# Patient Record
Sex: Female | Born: 1980 | Race: Black or African American | Hispanic: No | Smoking: Former smoker
Health system: Southern US, Community
[De-identification: ages and names within clinical notes are randomized; demographics above are authoritative.]

## PROBLEM LIST (undated history)

## (undated) DIAGNOSIS — I10 Essential (primary) hypertension: Secondary | ICD-10-CM

## (undated) DIAGNOSIS — R609 Edema, unspecified: Secondary | ICD-10-CM

## (undated) DIAGNOSIS — M549 Dorsalgia, unspecified: Secondary | ICD-10-CM

## (undated) DIAGNOSIS — R079 Chest pain, unspecified: Secondary | ICD-10-CM

## (undated) DIAGNOSIS — E785 Hyperlipidemia, unspecified: Secondary | ICD-10-CM

## (undated) DIAGNOSIS — R7303 Prediabetes: Secondary | ICD-10-CM

## (undated) DIAGNOSIS — E559 Vitamin D deficiency, unspecified: Secondary | ICD-10-CM

## (undated) DIAGNOSIS — F1010 Alcohol abuse, uncomplicated: Secondary | ICD-10-CM

## (undated) DIAGNOSIS — E669 Obesity, unspecified: Secondary | ICD-10-CM

## (undated) HISTORY — PX: TUBAL LIGATION: SHX77

## (undated) HISTORY — DX: Prediabetes: R73.03

## (undated) HISTORY — DX: Edema, unspecified: R60.9

## (undated) HISTORY — DX: Dorsalgia, unspecified: M54.9

## (undated) HISTORY — DX: Alcohol abuse, uncomplicated: F10.10

## (undated) HISTORY — DX: Vitamin D deficiency, unspecified: E55.9

## (undated) HISTORY — DX: Hyperlipidemia, unspecified: E78.5

## (undated) HISTORY — DX: Obesity, unspecified: E66.9

## (undated) HISTORY — DX: Essential (primary) hypertension: I10

## (undated) HISTORY — DX: Chest pain, unspecified: R07.9

---

## 2004-02-15 ENCOUNTER — Emergency Department (HOSPITAL_COMMUNITY): Admission: EM | Admit: 2004-02-15 | Discharge: 2004-02-15 | Payer: Self-pay | Admitting: Family Medicine

## 2005-04-20 ENCOUNTER — Emergency Department (HOSPITAL_COMMUNITY): Admission: EM | Admit: 2005-04-20 | Discharge: 2005-04-20 | Payer: Self-pay | Admitting: Emergency Medicine

## 2005-12-27 ENCOUNTER — Ambulatory Visit (HOSPITAL_COMMUNITY): Admission: RE | Admit: 2005-12-27 | Discharge: 2005-12-27 | Payer: Self-pay | Admitting: *Deleted

## 2006-01-11 ENCOUNTER — Ambulatory Visit (HOSPITAL_COMMUNITY): Admission: RE | Admit: 2006-01-11 | Discharge: 2006-01-11 | Payer: Self-pay | Admitting: *Deleted

## 2006-02-10 ENCOUNTER — Emergency Department (HOSPITAL_COMMUNITY): Admission: EM | Admit: 2006-02-10 | Discharge: 2006-02-10 | Payer: Self-pay | Admitting: Emergency Medicine

## 2006-02-22 ENCOUNTER — Ambulatory Visit: Payer: Self-pay | Admitting: *Deleted

## 2006-02-22 ENCOUNTER — Other Ambulatory Visit: Admission: RE | Admit: 2006-02-22 | Discharge: 2006-02-22 | Payer: Self-pay | Admitting: *Deleted

## 2006-03-08 ENCOUNTER — Ambulatory Visit: Payer: Self-pay | Admitting: Family Medicine

## 2006-06-07 ENCOUNTER — Ambulatory Visit: Payer: Self-pay | Admitting: *Deleted

## 2006-06-07 ENCOUNTER — Inpatient Hospital Stay (HOSPITAL_COMMUNITY): Admission: AD | Admit: 2006-06-07 | Discharge: 2006-06-08 | Payer: Self-pay | Admitting: Obstetrics & Gynecology

## 2006-06-11 ENCOUNTER — Inpatient Hospital Stay (HOSPITAL_COMMUNITY): Admission: AD | Admit: 2006-06-11 | Discharge: 2006-06-11 | Payer: Self-pay | Admitting: Obstetrics & Gynecology

## 2006-06-11 ENCOUNTER — Ambulatory Visit: Payer: Self-pay | Admitting: Obstetrics and Gynecology

## 2006-06-15 ENCOUNTER — Ambulatory Visit: Payer: Self-pay | Admitting: Obstetrics & Gynecology

## 2006-06-15 ENCOUNTER — Inpatient Hospital Stay (HOSPITAL_COMMUNITY): Admission: AD | Admit: 2006-06-15 | Discharge: 2006-06-19 | Payer: Self-pay | Admitting: Family Medicine

## 2006-06-16 ENCOUNTER — Encounter (INDEPENDENT_AMBULATORY_CARE_PROVIDER_SITE_OTHER): Payer: Self-pay | Admitting: Specialist

## 2006-06-22 ENCOUNTER — Inpatient Hospital Stay (HOSPITAL_COMMUNITY): Admission: AD | Admit: 2006-06-22 | Discharge: 2006-06-22 | Payer: Self-pay | Admitting: Obstetrics and Gynecology

## 2006-06-23 ENCOUNTER — Inpatient Hospital Stay (HOSPITAL_COMMUNITY): Admission: AD | Admit: 2006-06-23 | Discharge: 2006-06-23 | Payer: Self-pay | Admitting: Obstetrics and Gynecology

## 2006-11-01 ENCOUNTER — Ambulatory Visit: Payer: Self-pay | Admitting: Gynecology

## 2007-11-18 ENCOUNTER — Encounter: Payer: Self-pay | Admitting: Family Medicine

## 2007-11-18 ENCOUNTER — Ambulatory Visit: Payer: Self-pay | Admitting: Sports Medicine

## 2007-11-18 LAB — CONVERTED CEMR LAB
Antibody Screen: NEGATIVE
Basophils Absolute: 0 10*3/uL (ref 0.0–0.1)
Basophils Relative: 0 % (ref 0–1)
Hemoglobin: 11.1 g/dL — ABNORMAL LOW (ref 12.0–15.0)
Hepatitis B Surface Ag: NEGATIVE
Lymphocytes Relative: 33 % (ref 12–46)
Lymphs Abs: 2.7 10*3/uL (ref 0.7–4.0)
MCHC: 32.6 g/dL (ref 30.0–36.0)
Monocytes Relative: 5 % (ref 3–12)
Neutro Abs: 5 10*3/uL (ref 1.7–7.7)
Platelets: 303 10*3/uL (ref 150–400)
RDW: 15.1 % (ref 11.5–15.5)
Rubella: 31.6 intl units/mL — ABNORMAL HIGH

## 2007-11-26 ENCOUNTER — Encounter: Payer: Self-pay | Admitting: Family Medicine

## 2007-11-26 ENCOUNTER — Ambulatory Visit: Payer: Self-pay | Admitting: Family Medicine

## 2007-11-26 LAB — CONVERTED CEMR LAB
GC Probe Amp, Genital: NEGATIVE
Protein, U semiquant: NEGATIVE

## 2007-11-27 ENCOUNTER — Ambulatory Visit (HOSPITAL_COMMUNITY): Admission: RE | Admit: 2007-11-27 | Discharge: 2007-11-27 | Payer: Self-pay | Admitting: Family Medicine

## 2007-12-04 ENCOUNTER — Encounter: Payer: Self-pay | Admitting: Family Medicine

## 2007-12-04 ENCOUNTER — Encounter: Payer: Self-pay | Admitting: *Deleted

## 2007-12-05 ENCOUNTER — Encounter: Payer: Self-pay | Admitting: Family Medicine

## 2007-12-06 ENCOUNTER — Ambulatory Visit: Payer: Self-pay | Admitting: Family Medicine

## 2007-12-10 ENCOUNTER — Encounter: Payer: Self-pay | Admitting: Family Medicine

## 2007-12-16 ENCOUNTER — Encounter: Payer: Self-pay | Admitting: Family Medicine

## 2007-12-16 ENCOUNTER — Ambulatory Visit (HOSPITAL_COMMUNITY): Admission: RE | Admit: 2007-12-16 | Discharge: 2007-12-16 | Payer: Self-pay | Admitting: Family Medicine

## 2007-12-25 ENCOUNTER — Ambulatory Visit: Payer: Self-pay | Admitting: Family Medicine

## 2007-12-25 DIAGNOSIS — Z8742 Personal history of other diseases of the female genital tract: Secondary | ICD-10-CM

## 2007-12-25 LAB — CONVERTED CEMR LAB: Protein, U semiquant: NEGATIVE

## 2008-01-27 ENCOUNTER — Ambulatory Visit: Payer: Self-pay | Admitting: Sports Medicine

## 2008-01-27 LAB — CONVERTED CEMR LAB: Glucose, Urine, Semiquant: NEGATIVE

## 2008-02-03 ENCOUNTER — Ambulatory Visit: Payer: Self-pay | Admitting: Family Medicine

## 2008-02-03 ENCOUNTER — Encounter: Payer: Self-pay | Admitting: Family Medicine

## 2008-02-10 ENCOUNTER — Ambulatory Visit: Payer: Self-pay | Admitting: Family Medicine

## 2008-02-10 LAB — CONVERTED CEMR LAB: Protein, U semiquant: NEGATIVE

## 2008-03-02 ENCOUNTER — Ambulatory Visit: Payer: Self-pay | Admitting: Family Medicine

## 2008-03-02 LAB — CONVERTED CEMR LAB: Protein, U semiquant: NEGATIVE

## 2008-03-17 ENCOUNTER — Ambulatory Visit: Payer: Self-pay | Admitting: Family Medicine

## 2008-03-17 ENCOUNTER — Encounter: Payer: Self-pay | Admitting: Family Medicine

## 2008-03-17 LAB — CONVERTED CEMR LAB: Hemoglobin: 11 g/dL

## 2008-03-27 ENCOUNTER — Ambulatory Visit: Payer: Self-pay | Admitting: Family Medicine

## 2008-04-01 ENCOUNTER — Ambulatory Visit: Payer: Self-pay | Admitting: Family Medicine

## 2008-04-01 LAB — CONVERTED CEMR LAB: Glucose, Urine, Semiquant: NEGATIVE

## 2008-04-02 ENCOUNTER — Encounter: Payer: Self-pay | Admitting: Family Medicine

## 2008-04-09 ENCOUNTER — Ambulatory Visit: Payer: Self-pay | Admitting: Family Medicine

## 2008-04-09 LAB — CONVERTED CEMR LAB: Glucose, Urine, Semiquant: NEGATIVE

## 2008-04-13 ENCOUNTER — Ambulatory Visit: Payer: Self-pay | Admitting: Family Medicine

## 2008-04-13 LAB — CONVERTED CEMR LAB

## 2008-04-15 ENCOUNTER — Telehealth: Payer: Self-pay | Admitting: Family Medicine

## 2008-04-20 ENCOUNTER — Ambulatory Visit: Payer: Self-pay | Admitting: Sports Medicine

## 2008-04-20 ENCOUNTER — Encounter: Payer: Self-pay | Admitting: Family Medicine

## 2008-04-24 ENCOUNTER — Encounter: Payer: Self-pay | Admitting: Family Medicine

## 2008-04-24 ENCOUNTER — Telehealth: Payer: Self-pay | Admitting: *Deleted

## 2008-04-24 ENCOUNTER — Ambulatory Visit: Payer: Self-pay | Admitting: Obstetrics & Gynecology

## 2008-04-27 ENCOUNTER — Ambulatory Visit: Payer: Self-pay | Admitting: Obstetrics & Gynecology

## 2008-04-28 ENCOUNTER — Inpatient Hospital Stay (HOSPITAL_COMMUNITY): Admission: RE | Admit: 2008-04-28 | Discharge: 2008-05-01 | Payer: Self-pay | Admitting: Obstetrics & Gynecology

## 2008-04-28 ENCOUNTER — Ambulatory Visit: Payer: Self-pay | Admitting: Obstetrics & Gynecology

## 2008-04-29 ENCOUNTER — Ambulatory Visit: Payer: Self-pay | Admitting: Family Medicine

## 2008-04-29 ENCOUNTER — Encounter: Payer: Self-pay | Admitting: Obstetrics & Gynecology

## 2008-05-04 ENCOUNTER — Encounter: Payer: Self-pay | Admitting: *Deleted

## 2008-05-26 ENCOUNTER — Ambulatory Visit: Payer: Self-pay | Admitting: Family Medicine

## 2008-06-22 ENCOUNTER — Telehealth (INDEPENDENT_AMBULATORY_CARE_PROVIDER_SITE_OTHER): Payer: Self-pay | Admitting: *Deleted

## 2008-06-22 ENCOUNTER — Ambulatory Visit: Payer: Self-pay

## 2008-07-03 ENCOUNTER — Encounter: Payer: Self-pay | Admitting: Family Medicine

## 2009-01-07 ENCOUNTER — Encounter: Payer: Self-pay | Admitting: Family Medicine

## 2009-01-07 DIAGNOSIS — R221 Localized swelling, mass and lump, neck: Secondary | ICD-10-CM

## 2009-01-07 DIAGNOSIS — R220 Localized swelling, mass and lump, head: Secondary | ICD-10-CM | POA: Insufficient documentation

## 2010-12-19 ENCOUNTER — Encounter: Payer: Self-pay | Admitting: *Deleted

## 2011-03-14 NOTE — Discharge Summary (Signed)
NAMESUMAYAH, Cassandra Chandler NO.:  [id number]   MEDICAL RECORD NO.:  [id number]          PATIENT TYPE:  WOC   LOCATION:  WOC                          FACILITY:  [facility]   PHYSICIAN:  Allie Bossier, MD        DATE OF BIRTH:  June 22, 1981   DATE OF ADMISSION:  04/28/2008  DATE OF DISCHARGE:  05/01/2008                               DISCHARGE SUMMARY   ADMITTING DIAGNOSES:  35. A 30 year old gravid 5, para 1-0-3-1 with intrauterine pregnancy at      95 weeks' gestation.  2. History of prior cesarean section.  3. Desirous child labor after cesarean section.   DISCHARGE DIAGNOSES:  A 30 year old gravid 5, para 1-0-3-1,  postoperative day #2 status post repeat low-transverse cesarean section  for nonreassuring fetal heart rate.   PROCEDURE:  Repeat low-transverse cesarean section.   ADMITTING COURSE:  Please see written H and P.   HOSPITAL COURSE:  The patient was admitted for a scheduled induction of  labor at 24 weeks' gestation desiring a child labor after cesarean  section.  Risks and benefits of TOLAC were explained and the patient  verbalized understanding and consents were signed.  Foley bulb induction  was started at approximately 9 a.m. on June 30, and Pitocin was started  for cervical ripening.  The Foley bulb fell out on June 30 at  approximately 18:58 in the evening and the patient 4-5 cm dilated in a  cephalic presentation.  Artificial rupture of membranes were performed  to clear fluid and Pitocin was continued to be titrated for  contractions.  Fetal heart rate was reassuring in the 140s to 145.  The  patient progressed slowly throughout the course of the day and into the  second day of the induction.  Pitocin was stopped several times  secondary to variable decelerations; however, overall, the heart rate  was reassuring and attempts for TOLAC were continued secondary to  reassuring fetal heart rate and the patient's progression of cervical  dilation.  At  approximately 7:50 on July 1, the patient went at 7-8 cm  80% effaced, at a -1 station.  The patient progressed to __________;  however, at approximately 12:50 in the afternoon on July 1, a stat  cesarean section was performed secondary to fetal heart rate in the 60s  that was not able to restimulate it back to baseline with scalp  stimulation and stat cesarean section was called by Dr. Nicholaus Chandler.  Repeat low-transverse cesarean section was performed on April 29, 2008.   PREOPERATIVE DIAGNOSIS:  Induction of labor for post dates, trial of  labor after cesarean section (TOLAC), obesity, and fetal bradycardia.   POSTOPERATIVE DIAGNOSIS:  Induction of labor for post dates, trial of  labor after cesarean section (TOLAC), obesity, and fetal bradycardia.   PROCEDURE:  Repeat low-transverse cesarean section.  This surgery  produced a female infant with Apgars of 8 and 9, weighing 7 pounds 5  ounces.  Cord pH was 7.21, and estimated blood loss was 800 mL.  On  postoperative day #1, the patient's pain has been well controlled  on  oral pain meds.  Vital signs were stable.  She was breastfeeding.  Her  sutures were clean, dry, and intact.  On postoperative day #1, her  hemoglobin was 9.8, hematocrit was 28.7, white blood count was 9.7, and  platelets were 190.  On postoperative day #2, the patient was  discharged.  She had no complaints throughout.  Her vital signs were  stable and she was afebrile.  Her exam was within normal limits.   DISCHARGE DIAGNOSES:  A 30 year old gravida 5, para 2-0-1-2,  postoperative day #2 status post repeat low-transverse cesarean section  for nonreassuring fetal heart rate.   DISCHARGE CONDITION:  Good.   FOLLOWUP PLAN:  The patient is instructed to follow up next week with  [facility]qu[facility] for an incision check, that will  also coincide with the weight check for the baby.  She plans Micronor  for contraception at present.   DISCHARGE  PRESCRIPTIONS:  1. Percocet 5/325, #30, one to two p.o. q.4-6 h. p.r.n.  2. Ferrous sulphate 325 mg p.o. daily.  3. Colace 100 mg p.o. daily.  4. Micronor 1 p.o. daily.      Cassandra Chandler, C.N.M.      Allie Bossier, MD  Electronically Signed    SS/MEDQ  D:  05/01/2008  T:  05/01/2008  Job:  [id number]

## 2011-03-14 NOTE — Op Note (Signed)
NAME:  Cassandra Chandler, HUNDERTMARK NO.:  [id number]   MEDICAL RECORD NO.:  [id number]           PATIENT TYPE:   LOCATION:                                 FACILITY:   PHYSICIAN:  Allie Bossier, MD        DATE OF BIRTH:  [birth year]   DATE OF PROCEDURE:  DATE OF DISCHARGE:                               OPERATIVE REPORT   PREOPERATIVE DIAGNOSES:  Fetal bradycardia, failed induction of labor,  trial of labor after C-section, and obesity.   POSTOPERATIVE DIAGNOSES:  Fetal bradycardia, failed induction of labor,  trial of labor after C-section, and obesity.   PROCEDURE:  Repeat low transverse cesarean section, emergent.   SURGEON:  Myra C. Marice Potter, MD   ANESTHESIA:  Epidural, Burnett Corrente, MD   COMPLICATIONS:  None.   ESTIMATED BLOOD LOSS:  800 mL.   SPECIMENS:  Cord blood and placenta.   FINDINGS:  Living female infant with Apgars of 8 and 9 at 1 and 5  minutes, weight 7 pounds 5 ounces, cord pH 7.21.  Normal pelvic anatomy  with the exception of some adhesions of the omentum to the anterior  abdominal wall on the patient's right-hand side.   DETAILED PROCEDURE AND FINDINGS:  I was called into the room.  The fetal  heart rate via fetal scalp electrode was persistently in the 60s.  It  was not responding to patient position nor scalp stimulation as it had  been 4 hours ago.  I explained to her that C-section would be in the  baby's best interest.  She consented and she was rapidly taken to the  OR.  In the process, her epidural was bolused for surgery.  She was  taken to the operating room and quickly her abdomen was prepped and  draped in the usual sterile fashion.  Adequate anesthesia was assured.  It was noted that her Foley catheter was draining blood-tinged urine  prior to starting the case.  After adequate anesthesia was assured, a  transverse incision was made approximately 2 cm above the symphysis at  the natural crease.  Incision was carried down through the large  amount  of subcutaneous tissue to the fascia.  Fascia was scored in the midline.  Fascial incision was extended bilaterally.  The rectus muscles were  partially separated in a transverse fashion.  The peritoneum was entered  sharply and extended bluntly.  A bladder blade was placed.  A transverse  incision was made on the moderately well-developed lower uterine  segment.  Amniotomy was performed with hemostats.  The uterine incision  was extended bluntly.  The baby was deep in the pelvis at approximately  the +1 station.  The head was brought out of the incision with the  assistance of a vacuum extractor.  The mouth and nostrils were suctioned  after delivery of the baby and showed good tone and was starting to cry  upon being removed from the uterus.  Cord was clamped and cut, and the  baby was transferred to NICU personnel for further care.  Weight and  Apgars were  listed above.  Cord pH was obtained.  The clamp came off of  the cord at this point and so cord blood was not obtained.  Placenta was  extracted manually.  The uterus was left in situ.  The interior of the  uterus was cleaned with a dry lap sponge.  The uterine incision was  closed with two layers of 0 chromic running locking suture in the second  layer and imbricating in the first.  Excellent hemostasis was noted.  By  tilting the uterus, I was able to visualize the adnexa.  They were  normal.  The fascia and rectus muscles were noted to be hemostatic.  The  fascia was closed with a #1 Prolene in a running nonlocking fashion.  No  defects were palpable.  The subcutaneous tissue was irrigated, cleaned,  and dried.  It was then infiltrated with 30 mL of 0.5% Marcaine.  Subcuticular closure was done with 3-0 Vicryl suture.  Steri-Strips were  placed.  Her Foley catheter continued to drain blood-tinged fluid.  She  tolerated the procedure well.  She was taken to recovery room in stable  condition.  Instrument, sponge, and needle  counts were correct.      Allie Bossier, MD  Electronically Signed     MCD/MEDQ  D:  04/29/2008  T:  04/30/2008  Job:  [id number]

## 2011-03-17 NOTE — Group Therapy Note (Signed)
Cassandra Chandler, Cassandra Chandler NO.:  [id number]   MEDICAL RECORD NO.:  [id number]          PATIENT TYPE:  WOC   LOCATION:  [facility]                   FACILITY:  [facility]   PHYSICIAN:  Ginger Carne, MD DATE OF BIRTH:  [birth year]   DATE OF SERVICE:  11/01/2006                                  CLINIC NOTE   DATE OF VISIT:  November 01, 2006.   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  She had evidence of ASC-H favoring high  grade lesion on Pap smear.  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  In discussion with the patient, it  was appropriate to ask her to return in six months for re-Pap smear.  This represents HPV effect and it would be unreasonable at this time to  proceed with either cryotherapy or LEEP procedure.  The patient has  verbalized her understanding of this approach and will followup at the  Health Department in six months.           ______________________________  Ginger Carne, MD     SHB/MEDQ  D:  11/01/2006  T:  11/01/2006  Job:  [id number]

## 2011-03-17 NOTE — Group Therapy Note (Signed)
NAME:  Cassandra Chandler, Cassandra Chandler NO.:  [id number]   MEDICAL RECORD NO.:  [id number]          PATIENT TYPE:  WOC   LOCATION:  [facility]                   FACILITY:  [facility]   PHYSICIAN:  Kathlyn Sacramento, M.D.   DATE OF BIRTH:  [birth year]   DATE OF SERVICE:  03/08/2006                                    CLINIC NOTE   CHIEF COMPLAINT:  The patient is here for followup on her colposcopy.   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  She underwent a colposcopy on February 22, 2006, and had a biopsy which showed squamous metaplasia associated with  stromal deciduation.   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   PLAN:  The patient is to follow up with a colposcopy after delivery.  The  plan was discussed with Dr. Shawnie Pons.           ______________________________  Kathlyn Sacramento, M.D.     AC/MEDQ  D:  03/08/2006  T:  03/09/2006  Job:  [id number]

## 2011-03-17 NOTE — Discharge Summary (Signed)
[name], PARADISE NO.:  [id number]   MEDICAL RECORD NO.:  [id number]          PATIENT TYPE:  INP   LOCATION:  9124                          [facility]   PHYSICIAN:  Lesly Dukes, M.D. DATE OF BIRTH:  1980-12-30   DATE OF ADMISSION:  06/15/2006  DATE OF DISCHARGE:  06/19/2006                                 DISCHARGE SUMMARY   DISCHARGE DIAGNOSES:  1. Induction of labor for post dates.  2. Delivery of a viable female infant.  3. Primary low transverse cesarean section for nonreassuring fetal heart      tones.   DISCHARGE MEDICATIONS:  1. Ibuprofen 600 mg p.o. q.6h. p.r.n. pain.  2. Percocet 5/325 one to two tablets p.o. q.4-6h. p.r.n. pain.  3. Colace 100 mg p.o. b.i.d. p.r.n. constipation.  4. Prenatal vitamins one p.o. daily.  5. Micronor one p.o. daily.   DISCHARGE LABORATORY DATA:  White blood count 7.8, hemoglobin 10.8,  hematocrit 31.5, platelets 228.   FOLLOWUP:  The patient is to follow up in 6 weeks at [facility] for a  postpartum visit.  The patient is to return to the MAU in 2-4 days to have  her staples removed.   PROCEDURES:  Primary LTCS for nonreassuring fetal heart tones.   CONSULTS:  None.   HOSPITAL COURSE:  Ms. Cassandra Chandler is a 30 year old G3, P0-0-2-0 who was  admitted on August 17 for induction of labor for post dates.  Her induction  was started with Cytotec x1.  The patient was contracting every 3 minutes  with this and was unable to get a second Cytotec.  A Cervidil was placed and  she progressed and Pitocin was started.  On the Pitocin the patient had late  decelerations with adequate contractions and when the Pitocin was stopped  the patient was unable to have adequate labor.  An amnioinfusion was  performed but the patient still had late decelerations while on Pitocin.  She was unable to tolerate the Pitocin and was unable to have adequate labor  without the Pitocin.  She was consented for primary LTCS for  nonreassuring  fetal heart tones.  Please see dictation for the operative report.  The  patient delivered a viable female infant, had an unremarkable postoperative  course.  She is to follow up in [facility] with [facility] and to report to the MAU  in 2-4 days for her staple removal.  She is bottle and breast feeding and is  using Ortho Micronor for birth control.  She was A positive, antibody  negative, rubella immune, and GBS negative.     ______________________________  Levander Campion, M.D.    ______________________________  Lesly Dukes, M.D.    JH/MEDQ  D:  06/19/2006  T:  06/19/2006  Job:  [id number]

## 2011-03-17 NOTE — Op Note (Signed)
NAME:  Cassandra Chandler, Cassandra Chandler NO.:  [id number]   MEDICAL RECORD NO.:  [id number]          PATIENT TYPE:  INP   LOCATION:                                [facility]   PHYSICIAN:  Lesly Dukes, M.D. DATE OF BIRTH:  [birth year]   DATE OF PROCEDURE:  06/16/2006  DATE OF DISCHARGE:                                 OPERATIVE REPORT   PREOPERATIVE DIAGNOSIS:  30 year old female with non-reassuring fetal heart  tracing unable to tolerate labor.   POSTOPERATIVE DIAGNOSIS:  30 year old female with non-reassuring fetal heart  tracing unable to tolerate labor.   PROCEDURE:  Primary low transverse cesarean section.   SURGEON:  Lesly Dukes, M.D.   ASSISTANT:  Darl Pikes Christmas, C.N.M.   ANESTHESIA:  Spinal.   SPECIMENS:  Placenta to pathology.   ESTIMATED BLOOD LOSS:  800 mL.   COMPLICATIONS:  None.   FINDINGS:  Viable female infant, vertex, clear fluid, nuchal cord x1.  Normal uterus, ovaries, and fallopian tube.  Arterial cord blood gas was  7.22.   DESCRIPTION OF PROCEDURE:  After informed consent was obtained, the patient  was taken to the operating room where epidural anesthesia was found to be  adequate.  The patient was placed in the dorsal supine position with a  leftward tilt.  A Foley was already in the bladder.  The patient was prepped  and draped in the normal sterile fashion.  A Pfannenstiel skin incision was  made with the scalpel and carried down to excellent hemostasis fascia.  The  fascia was incised in the midline and extended bilaterally.  The superior  and inferior aspect of the fascial incision were grasped with Kocher clamps,  tented up, and dissected off sharply and bluntly from the underlying rectus  muscles.  The rectus muscles were separated in the midline.  The peritoneum  was entered bluntly.  The incision was extended both superiorly and  inferiorly with good visualization of the bladder.  The bladder blade was  inserted.  The  vesicouterine peritoneum was identified, picked up, and  entered sharply with Metzenbaum scissors.  The incision was extended  bilaterally and the bladder flap was created digitally.  The bladder blade  was reinserted.  A uterine incision was made in transverse fashion in the  lower uterine segment.  This incision was extended bilaterally bluntly.  The  head delivered atraumatically.  The nose and mouth were suctioned.  The rest  of the baby delivered easily.  The cord clamped and cut and the baby was  handed off to the awaiting pediatrician.  Cord blood was sent for type and  screen and arterial cord blood sent.  The placenta delivered manually with  three vessel cord.  The uterus was exteriorized and cleared of all clots and  debris.  The uterine incision was closed using 0 Vicryl in a running locked  fashion, good hemostasis was noted.  The uterus was returned to the abdomen  and noted to be hemostatic off tension.  Copious irrigation of the  peritoneal cavity was performed and the uterus noted to be hemostatic  one  last time.  The rectus muscles were noted to be hemostatic.  The fascia was  closed with 0 Vicryl in a  running fashion.  The subcutaneous tissues were copiously irrigated and  found to be hemostatic.  The skin was closed with staples.  The patient  tolerated the procedure well.  Sponge, lap, instrument, and needle counts  were correct x 2.  The patient went to the recovery room in stable  condition.           ______________________________  Lesly Dukes, M.D.     KHL/MEDQ  D:  06/16/2006  T:  06/16/2006  Job:  [id number]

## 2011-04-17 ENCOUNTER — Ambulatory Visit (INDEPENDENT_AMBULATORY_CARE_PROVIDER_SITE_OTHER): Payer: 59 | Admitting: Family Medicine

## 2011-04-17 ENCOUNTER — Other Ambulatory Visit (HOSPITAL_COMMUNITY)
Admission: RE | Admit: 2011-04-17 | Discharge: 2011-04-17 | Disposition: A | Discharge status: Home / Self Care | Payer: 59 | Source: Ambulatory Visit | Attending: Family Medicine | Admitting: Family Medicine

## 2011-04-17 ENCOUNTER — Encounter: Payer: Self-pay | Admitting: Family Medicine

## 2011-04-17 VITALS — BP 138/86 | HR 72 | Temp 98.2°F | Ht 64.0 in | Wt 219.4 lb

## 2011-04-17 DIAGNOSIS — Z23 Encounter for immunization: Secondary | ICD-10-CM

## 2011-04-17 DIAGNOSIS — H6120 Impacted cerumen, unspecified ear: Secondary | ICD-10-CM

## 2011-04-17 DIAGNOSIS — Z124 Encounter for screening for malignant neoplasm of cervix: Secondary | ICD-10-CM

## 2011-04-17 DIAGNOSIS — Z01419 Encounter for gynecological examination (general) (routine) without abnormal findings: Secondary | ICD-10-CM

## 2011-04-17 DIAGNOSIS — H60399 Other infective otitis externa, unspecified ear: Secondary | ICD-10-CM

## 2011-04-17 DIAGNOSIS — H6090 Unspecified otitis externa, unspecified ear: Secondary | ICD-10-CM

## 2011-04-17 MED ORDER — HYDROCORTISONE-ACETIC ACID 1-2 % OT SOLN: 4.0000 [drp] | Freq: Four times a day (QID) | OTIC | Status: AC

## 2011-04-17 MED ORDER — TETANUS-DIPHTH-ACELL PERTUSSIS 5-2.5-18.5 LF-MCG/0.5 IM SUSP: 0.5000 mL | Freq: Once | INTRAMUSCULAR | Status: DC

## 2011-04-17 NOTE — Patient Instructions (Signed)
Make appt for fasting labwork ( no eating or drinking except water for 8 hours) Blood pressure is elevated 138/86.  If stays above 140/90, would consider medicines.  You can make a big difference with weight loss, nutrition. Ask for help!  See if your health insurance will cover any nutrition visit- we have a phd nutritionist in the office.  Also consider weight watchers, a Systems analyst, or reading about nutrition to learn more. I will send you a letter if results are normal, otherwise will call to discuss No Q-tips!  Try a few drops of hydrogen peroxide daily to help keep things soft. Follow-up annually or sooner if needed.

## 2011-04-17 NOTE — Progress Notes (Signed)
  Subjective:    Patient ID: Cassandra Chandler, female    DOB: 1981-01-24, 30 y.o.   MRN: [id number]  HPI  Annual Gynecological Exam  G5P2A2+1L2 Wt Readings from Last 3 Encounters:  04/17/11 219 lb 6.4 oz (99.519 kg)  06/22/08 215 lb 6.4 oz (97.705 kg)  05/26/08 208 lb 12.8 oz (94.711 kg)   Last period: 03/27/2011 Regular periods: yes Heavy bleeding: no  Sexually active: yes Birth control or hormonal therapy:Mirena Hx of STD: Patient desires STD screening Dyspareunia: No Hot flashes: No Vaginal discharge: no Dysuria:No  Last mammogram: never Breast mass or concerns: No Last WUJ:WJXBJYN years History of abnormal pap: yes  FH of breast, uterine, ovarian, colon cancer: No  Has had trouble hearing out of left ear for several days.  Uses qtips.  Notes ear wax.  Review of Systems Gen:  No fever, chills, unexplained weight loss Ears:  No hearing loss, ringing Eyes: No vision changes, double vision, eye drainage Nose:  No rhinorrhea, congestion Throat:  No sore throat or dysphagia CV:  No chest pain, palpitations, PND, dyspnea on exertion, or edema Resp: No cough, dyspnea, wheezing Abd: No nausea, vomting, diarrhea, constipation, or change in bowel color, size, or caliber. MSK: no joint pain, myalgias SKIN: no rash, changing moles GU: No dysuria, hematuria, vaginal discharge      Objective:   Physical Exam    BP 138/86  Pulse 72  Temp 98.2 F (36.8 C)  Ht 5\' 4"  (1.626 m)  Wt 219 lb 6.4 oz (99.519 kg)  BMI 37.66 kg/m2  LMP 03/27/2011  General Appearance:    Alert, cooperative, no distress, appears stated age  Head:    Normocephalic, without obvious abnormality, atraumatic  Eyes:    PERRL, conjunctiva/corneas clear, EOM's intact,  both eyes  Ears:    Normal TM's, both canals blocked with cerumen, after irrigation, left canal with excoriations, erythematous  Nose:   Nares normal, septum midline, mucosa normal, no drainage    or sinus tenderness  Throat:   Lips,  mucosa, and tongue normal; teeth and gums normal  Neck:   Supple, symmetrical, trachea midline, no adenopathy;    thyroid:  no enlargement/tenderness/nodules; no carotid   bruit or JVD  Back:     Symmetric, no curvature, ROM normal, no CVA tenderness  Lungs:     Clear to auscultation bilaterally, respirations unlabored  Chest Wall:    No tenderness or deformity   Heart:    Regular rate and rhythm, S1 and S2 normal, no murmur, rub   or gallop  Breast Exam:    No tenderness, masses, or nipple abnormality  Abdomen:     Soft, non-tender, bowel sounds active all four quadrants,    no masses, no organomegaly  Genitalia:    Normal female without lesion, discharge or tenderness     Extremities:   Extremities normal, atraumatic, no cyanosis or edema  Pulses:   2+ and symmetric all extremities  Skin:   Skin color, texture, turgor normal, no rashes or lesions  Lymph nodes:   Cervical, supraclavicular, and axillary nodes normal         Assessment & Plan:

## 2011-04-18 DIAGNOSIS — Z01419 Encounter for gynecological examination (general) (routine) without abnormal findings: Secondary | ICD-10-CM | POA: Insufficient documentation

## 2011-04-18 DIAGNOSIS — H6120 Impacted cerumen, unspecified ear: Secondary | ICD-10-CM | POA: Insufficient documentation

## 2011-04-18 NOTE — Assessment & Plan Note (Addendum)
Discussed obesity, nutrition and exercise, and focused on preventing future HTn and DM.  Given ideas for resources.  Given patient handouts.  Ordered fasting labs for identify risk factors for modifcation

## 2011-04-18 NOTE — Assessment & Plan Note (Signed)
Irrigated, discussed ear hygeine to prevent recurrence

## 2011-04-18 NOTE — Assessment & Plan Note (Signed)
After cerumen irrigation, noted otits externa in left ear.  Prescribed vosol-hc, discussed ear hygeine, avoid qtips.

## 2011-04-18 NOTE — Assessment & Plan Note (Signed)
Mirena for contraception.  Will check Pap today.  Normal exam, follow-up in 1 year.

## 2011-04-26 ENCOUNTER — Other Ambulatory Visit: Payer: 59

## 2011-04-28 ENCOUNTER — Telehealth: Payer: Self-pay | Admitting: Family Medicine

## 2011-04-28 NOTE — Telephone Encounter (Signed)
Left message.  Sent letter to ask her to schedule colpo clinic for LSIL on pap.  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  Last known pap in EMR was in 2009 at which time tx was deferred due to pregnancy.  Jan 2009: HIGH GRADE SQUAMOUS INTRAEPITHELIAL LESION:  CIN-2/ VAIN-2/ CIN-3/ VAIN-3/ CIS RECOMMENDATION(S): There are atypical glands present which may indicate possible gland involvement with dysplasia. Clinical correlation is recommended.

## 2011-06-01 ENCOUNTER — Other Ambulatory Visit: Payer: 59

## 2011-06-01 ENCOUNTER — Ambulatory Visit (INDEPENDENT_AMBULATORY_CARE_PROVIDER_SITE_OTHER): Payer: 59 | Admitting: Family Medicine

## 2011-06-01 ENCOUNTER — Encounter: Payer: Self-pay | Admitting: Family Medicine

## 2011-06-01 VITALS — BP 136/97 | HR 77 | Temp 97.5°F | Ht 64.0 in | Wt 219.0 lb

## 2011-06-01 DIAGNOSIS — R87619 Unspecified abnormal cytological findings in specimens from cervix uteri: Secondary | ICD-10-CM

## 2011-06-01 LAB — COMPREHENSIVE METABOLIC PANEL
ALT: 12 U/L (ref 0–35)
AST: 12 U/L (ref 0–37)
BUN: 11 mg/dL (ref 6–23)
Calcium: 9.5 mg/dL (ref 8.4–10.5)
Chloride: 105 mEq/L (ref 96–112)
Creat: 0.6 mg/dL (ref 0.50–1.10)
Total Bilirubin: 0.3 mg/dL (ref 0.3–1.2)

## 2011-06-01 LAB — TSH: TSH: 1.54 u[IU]/mL (ref 0.350–4.500)

## 2011-06-01 LAB — LIPID PANEL
Cholesterol: 150 mg/dL (ref 0–200)
HDL: 43 mg/dL (ref >39)
Total CHOL/HDL Ratio: 3.5 Ratio
VLDL: 19 mg/dL (ref 0–40)

## 2011-06-01 NOTE — Progress Notes (Signed)
  Subjective:    Patient ID: Cassandra Chandler, female    DOB: [birth year], 30 y.o.   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 At [facility]?) several years ago Has 2 children 3 and 5 yoa mirena in[place]-11/29/2010 --checks her strings monthly  G5P2032 s/p c-section in 7/09   Review of Systems    no pelvic pain or abnormal vaginal bleeding Objective:   Physical Exam   WD female NAD GU externally normal bimanual without worrisome mass  Patient given informed consent, signed copy in the chart.  Placed in lithotomy position. Cervix viewed with speculum and colposcope after application of acetic acid.   Colposcopy adequate (entire squamocolumnar junctions seen  in entirety) ?  yes Acetowhite lesions?no Punctation?no Mosaicism?  no Abnormal vasculature?  no Biopsies?no ECC?no Complications? no  COMMENTS: Patient was given post procedure instructions. No restrictions..      Assessment & Plan:  LGSIL on pap w hx prior abnormal. Do not see any notes of her previous colpo in e chart, epic or centricity. She cannot remember where it was done but she thinks [facility]. Clinically normal colposcopy today--pap in one year.

## 2011-06-01 NOTE — Progress Notes (Signed)
Cmp,flp and tsh Cassandra Chandler

## 2011-06-08 ENCOUNTER — Encounter: Payer: Self-pay | Admitting: Family Medicine

## 2011-07-27 LAB — COMPREHENSIVE METABOLIC PANEL
AST: 14
Albumin: 2.8 — ABNORMAL LOW
Chloride: 105
Creatinine, Ser: 0.37 — ABNORMAL LOW
GFR calc Af Amer: >60
Potassium: 3.6
Sodium: 135
Total Bilirubin: 0.7

## 2011-07-27 LAB — CBC
HCT: 28.7 — ABNORMAL LOW
Hemoglobin: 9.8 — ABNORMAL LOW
MCHC: 34.2
MCHC: 34
MCHC: 34
MCV: 85.8
MCV: 86
Platelets: 238
Platelets: 244
Platelets: 229
RDW: 14.9
RDW: 15.4
WBC: 6.5
WBC: 7.6

## 2011-07-27 LAB — URINALYSIS, DIPSTICK ONLY
Bilirubin Urine: NEGATIVE
Ketones, ur: >80 — AB
Nitrite: NEGATIVE
pH: 6

## 2011-07-27 LAB — RPR: RPR Ser Ql: NONREACTIVE

## 2012-08-21 ENCOUNTER — Encounter: Payer: Self-pay | Admitting: Family Medicine

## 2012-08-21 ENCOUNTER — Ambulatory Visit (INDEPENDENT_AMBULATORY_CARE_PROVIDER_SITE_OTHER): Payer: 59 | Admitting: Family Medicine

## 2012-08-21 VITALS — BP 145/93 | HR 84 | Temp 98.2°F | Ht 65.0 in | Wt 238.0 lb

## 2012-08-21 DIAGNOSIS — Z309 Encounter for contraceptive management, unspecified: Secondary | ICD-10-CM

## 2012-08-21 DIAGNOSIS — R030 Elevated blood-pressure reading, without diagnosis of hypertension: Secondary | ICD-10-CM

## 2012-08-21 NOTE — Patient Instructions (Addendum)
Please send me some BP readings. Let me know when you want a new IUD Great to see you!

## 2012-08-22 DIAGNOSIS — R030 Elevated blood-pressure reading, without diagnosis of hypertension: Secondary | ICD-10-CM | POA: Insufficient documentation

## 2012-08-22 NOTE — Progress Notes (Signed)
  Subjective:    Patient ID: Cassandra Chandler, female    DOB: 1981-09-06, 31 y.o.   MRN: [id number]  HPI #1. Here for well adult physical. Has some questions about contraception. She is currently using Maiorino without any problem. It will be 3 years next year. She thinks she would like to continue with an IUD. #2. Had one episode of right lower extremity pain it sounds like sciatica. She's not sure what started but resolved after about 2 weeks she's had no more problems. #3. She does have some chronic low back pain. Does a lot of sitting at her job. Pain does not radiate into the buttock or below. No bowel or bladder incontinence. Intermittent. Aching in nature. 4/10 at the most and use it resolves with rest.   Review of Systems Positive only for his issues listed in history of present illness. Otherwise 14 point review of systems is negative.    Objective:   Physical Exam Vital signs reviewed GENERALl: Well developed, well nourished, in no acute distress. NECK: Supple, FROM, without lymphadenopathy.  THYROID: normal without nodularity CAROTID ARTERIES: without bruits LUNGS: clear to auscultation bilaterally. No wheezes or rales. HEART: Regular rate and rhythm, no murmurs ABDOMEN: soft with positive bowel sounds MSK: MOE x 4 SKIN no rash NEURO: no focal deficits        Assessment & Plan:

## 2012-08-23 DIAGNOSIS — Z309 Encounter for contraceptive management, unspecified: Secondary | ICD-10-CM | POA: Insufficient documentation

## 2012-08-23 NOTE — Assessment & Plan Note (Signed)
She'll take some blood pressure readings and mail them back to me. If they're elevated as I suspect they will be we will need to initiate treatment for hypertension.

## 2013-06-05 ENCOUNTER — Encounter: Payer: Self-pay | Admitting: Family Medicine

## 2013-06-05 ENCOUNTER — Ambulatory Visit (INDEPENDENT_AMBULATORY_CARE_PROVIDER_SITE_OTHER): Payer: 59 | Admitting: Family Medicine

## 2013-06-05 VITALS — BP 131/88 | HR 72 | Ht 65.0 in | Wt 224.0 lb

## 2013-06-05 DIAGNOSIS — H109 Unspecified conjunctivitis: Secondary | ICD-10-CM

## 2013-06-05 MED ORDER — ERYTHROMYCIN 5 MG/GM OP OINT: TOPICAL_OINTMENT | OPHTHALMIC | Status: DC

## 2013-06-05 NOTE — Patient Instructions (Addendum)
It was nice to meet you today!  You have conjunctivitis of your eye. I sent in a medicine for you to use.  If your eye is getting worse, you have increased pain, light hurts your eyes, or you have decreased vision, call us right away.  Schedule a follow up appointment with Dr. Jennette Kettle to go over health maintenance items.  Be well, Dr. Pollie Meyer   Conjunctivitis Conjunctivitis is commonly called "pink eye." Conjunctivitis can be caused by bacterial or viral infection, allergies, or injuries. There is usually redness of the lining of the eye, itching, discomfort, and sometimes discharge. There may be deposits of matter along the eyelids. A viral infection usually causes a watery discharge, while a bacterial infection causes a yellowish, thick discharge. Pink eye is very contagious and spreads by direct contact. You may be given antibiotic eyedrops as part of your treatment. Before using your eye medicine, remove all drainage from the eye by washing gently with warm water and cotton balls. Continue to use the medication until you have awakened 2 mornings in a row without discharge from the eye. Do not rub your eye. This increases the irritation and helps spread infection. Use separate towels from other household members. Wash your hands with soap and water before and after touching your eyes. Use cold compresses to reduce pain and sunglasses to relieve irritation from light. Do not wear contact lenses or wear eye makeup until the infection is gone. SEEK MEDICAL CARE IF:   Your symptoms are not better after 3 days of treatment.  You have increased pain or trouble seeing.  The outer eyelids become very red or swollen. Document Released: 11/23/2004 Document Revised: 01/08/2012 Document Reviewed: 10/16/2005 [facility] Patient Information 2014 [place], [place].

## 2013-06-05 NOTE — Progress Notes (Signed)
Patient ID: Cassandra Chandler, female   DOB: 1981-05-29, 32 y.o.   MRN: [id number]   HPI:  Pt presents for a same day appointment to discuss eye pain.  Eye pain: Began on Monday. Initially felt like something was present in her eye. Could feel the foreign object moving when she moved her eye around. Started to get red on Tuesday. Now feels like she has a clear jelly-like material on outside of eye. Has had clear discharge, like tears. Right now has just a little bit of pain. No problems with vision.  Tried some eye drops but these didn't help. No fevers. Is otherwise feeling well. No photophobia. Does wear glasses sometimes but doesn't have them today.  ROS: See HPI  PHYSICAL EXAM: BP 131/88  Pulse 72  Ht 5\' 5"  (1.651 m)  Wt 224 lb (101.606 kg)  BMI 37.28 kg/m2  LMP 05/01/2013 Visual acuity: R 20/30, L 20/60, both 20/30 Gen: NAD HEENT: bilateral PERRL. EOMI. No pain with EOMI. L eye normal in appearance. R eye has marked erythema and conjunctival edema. Inferior conjunctiva is beefy-red. No perilimbal injection. No drainage noted. Fluorescein eye exam shows no corneal abrasions.  ASSESSMENT/PLAN:  # Conjunctivitis: -likely severe case of conjunctivitis given conjunctival injection and edema. No abnormalities on fluorescein exam. No red flags (marked eye pain, photophobia, vision loss). Most likely viral, but given severity of exam will treat with erythromycin opthalmic ointment x 5 days -given return precautions per AVS. -Precepted with Dr. Leveda Anna who also examined patient and agrees with this plan

## 2013-06-26 ENCOUNTER — Other Ambulatory Visit (HOSPITAL_COMMUNITY)
Admission: RE | Admit: 2013-06-26 | Discharge: 2013-06-26 | Disposition: A | Discharge status: Home / Self Care | Payer: 59 | Source: Ambulatory Visit | Attending: Family Medicine | Admitting: Family Medicine

## 2013-06-26 ENCOUNTER — Ambulatory Visit (INDEPENDENT_AMBULATORY_CARE_PROVIDER_SITE_OTHER): Payer: 59 | Admitting: Family Medicine

## 2013-06-26 ENCOUNTER — Encounter: Payer: Self-pay | Admitting: Family Medicine

## 2013-06-26 VITALS — BP 118/85 | HR 77 | Temp 98.4°F | Ht 64.0 in | Wt 227.0 lb

## 2013-06-26 DIAGNOSIS — Z01419 Encounter for gynecological examination (general) (routine) without abnormal findings: Secondary | ICD-10-CM | POA: Insufficient documentation

## 2013-06-26 DIAGNOSIS — N760 Acute vaginitis: Secondary | ICD-10-CM

## 2013-06-26 DIAGNOSIS — R87619 Unspecified abnormal cytological findings in specimens from cervix uteri: Secondary | ICD-10-CM

## 2013-06-26 DIAGNOSIS — Z309 Encounter for contraceptive management, unspecified: Secondary | ICD-10-CM

## 2013-06-26 DIAGNOSIS — Z3049 Encounter for surveillance of other contraceptives: Secondary | ICD-10-CM

## 2013-06-26 MED ORDER — DESOGESTREL-ETHINYL ESTRADIOL 0.15-30 MG-MCG PO TABS: 1.0000 | ORAL_TABLET | Freq: Every day | ORAL | Status: DC

## 2013-06-26 NOTE — Assessment & Plan Note (Signed)
Repeat pap today Long discussion re her several abnormal paps in past. I recommend pap in one year unless the pap we did today comes back abnormal in some way. I will notify her

## 2013-06-26 NOTE — Assessment & Plan Note (Signed)
Mirena removed. She opted for OCp and I have called those in. She is still contemplating whether or not she wants a second IUD. Was interested  In patch but habitus precludes.

## 2013-06-26 NOTE — Progress Notes (Signed)
Patient ID: Cassandra Chandler, female   DOB: [birth year], 32 y.o.   MRN: [id number] Pt here for removal of IUD (expireing) that was placed 5 y ago.    Wants discussion about alternative options for contraception.   On review of her chart, her PERTINENT  PMH / PSH: 2009 Hi grade:There are atypical glands present which may indicate possible gland involvement with dysplasia. Clinical correlation is recommended. Pt report she had colpo (outside facility--we have no records).  2012; LGSIL with clinically normal colpo--was supposed to have pap one year later. I do not see that results. gravid 5, para 1-0-3-1   PROCEDURE NOTE: IUD removal Patient given informed consent for IUD removal. She is aware this will stop the birth control method provided by the IUD immediately. Informed consent given and signed copy in the chart. Patient placed in the ;ithotomy position and the cervix brought into view using speculum. The IUD strings were identified coming from the cervical os. These strings were grasped with ring forceps, and the IUD withdrawn gently from the uterus. There were no complications and no blood loss. Patient tolerated the procedure well.   PELVIC: no adnexal masses. Normal uterus position and size. Introitus normal without lesions.

## 2013-07-02 ENCOUNTER — Encounter: Payer: Self-pay | Admitting: Family Medicine

## 2013-12-03 ENCOUNTER — Other Ambulatory Visit (HOSPITAL_COMMUNITY)
Admission: RE | Admit: 2013-12-03 | Discharge: 2013-12-03 | Disposition: A | Discharge status: Home / Self Care | Payer: 59 | Source: Ambulatory Visit | Attending: Family Medicine | Admitting: Family Medicine

## 2013-12-03 ENCOUNTER — Ambulatory Visit (INDEPENDENT_AMBULATORY_CARE_PROVIDER_SITE_OTHER): Payer: 59 | Admitting: Family Medicine

## 2013-12-03 ENCOUNTER — Encounter: Payer: Self-pay | Admitting: Family Medicine

## 2013-12-03 VITALS — BP 131/82 | HR 83 | Temp 98.6°F | Ht 64.0 in | Wt 233.7 lb

## 2013-12-03 DIAGNOSIS — Z1151 Encounter for screening for human papillomavirus (HPV): Secondary | ICD-10-CM | POA: Insufficient documentation

## 2013-12-03 DIAGNOSIS — Z124 Encounter for screening for malignant neoplasm of cervix: Secondary | ICD-10-CM

## 2013-12-03 DIAGNOSIS — Z01419 Encounter for gynecological examination (general) (routine) without abnormal findings: Secondary | ICD-10-CM | POA: Insufficient documentation

## 2013-12-03 DIAGNOSIS — R030 Elevated blood-pressure reading, without diagnosis of hypertension: Secondary | ICD-10-CM

## 2013-12-03 DIAGNOSIS — Z309 Encounter for contraceptive management, unspecified: Secondary | ICD-10-CM

## 2013-12-03 DIAGNOSIS — R87619 Unspecified abnormal cytological findings in specimens from cervix uteri: Secondary | ICD-10-CM

## 2013-12-03 MED ORDER — ETONOGESTREL-ETHINYL ESTRADIOL 0.12-0.015 MG/24HR VA RING: VAGINAL_RING | VAGINAL | Status: DC

## 2013-12-03 NOTE — Patient Instructions (Signed)
Keep a check on your blood pressure and if it is greater than 140 over 80 on several occasions we need to have you come back in.  I will send you a note about your pap. If you are not happy with the Nuva ring let me know and we can discuss other options.  Great to see you!

## 2013-12-03 NOTE — Progress Notes (Signed)
   Subjective:    Patient ID: Cassandra Chandler, female    DOB: 05/11/1981, 33 y.o.   MRN: [id number]  HPI #1. Here for CPE. #2. Has lost about 7 pounds doing a liquid diet. She's been trying to walk more often but is still little frustrated with her weight. #3. Contraception: She found herself missing the oral contraceptives several days a week.   Review of Systems  Constitutional: Negative for activity change, appetite change, fatigue and unexpected weight change.  HENT: Negative for ear pain and trouble swallowing.   Eyes: Negative for pain and visual disturbance.  Respiratory: Negative for shortness of breath and wheezing.   Cardiovascular: Negative for chest pain, palpitations and leg swelling.  Gastrointestinal: Negative for abdominal pain, diarrhea and constipation.  Endocrine: Negative for polyphagia and polyuria.  Genitourinary: Negative for menstrual problem and pelvic pain.  Musculoskeletal: Negative for arthralgias, back pain and joint swelling.  Neurological: Negative for weakness and numbness.  Psychiatric/Behavioral: Negative for dysphoric mood. The patient is not nervous/anxious.        Objective:   Physical Exam  Constitutional: She is oriented to person, place, and time. She appears well-developed and well-nourished.  HENT:  Right Ear: External ear normal.  Left Ear: External ear normal.  Mouth/Throat: Oropharynx is clear and moist.  Eyes: Conjunctivae and EOM are normal. Pupils are equal, round, and reactive to light.  Neck: Normal range of motion. Neck supple. No thyromegaly present.  Cardiovascular: Normal rate, normal heart sounds and intact distal pulses.   Pulmonary/Chest: Effort normal and breath sounds normal. She has no wheezes.  Abdominal: Soft. Bowel sounds are normal. She exhibits no distension. There is no tenderness.  Genitourinary: Vagina normal and uterus normal. No vaginal discharge found.  Musculoskeletal: Normal range of motion.    Lymphadenopathy:    She has no cervical adenopathy.  Neurological: She is alert and oriented to person, place, and time. She has normal reflexes.  Skin: No rash noted.  Psychiatric: She has a normal mood and affect. Her behavior is normal. Judgment and thought content normal.          Assessment & Plan:  #1. CPE. Past her today as she has had a history of abnormals. #2. History of intermittently elevated blood pressures. She'll check some pressures and let me know if they're above 140/80. #3. Contraception: We'll start her on Nuva ring And she does not like that we will consider IUD or implantable.

## 2013-12-09 ENCOUNTER — Encounter: Payer: Self-pay | Admitting: Family Medicine

## 2014-01-20 ENCOUNTER — Encounter: Payer: Self-pay | Admitting: Family Medicine

## 2014-01-20 ENCOUNTER — Ambulatory Visit (INDEPENDENT_AMBULATORY_CARE_PROVIDER_SITE_OTHER): Payer: 59 | Admitting: Family Medicine

## 2014-01-20 VITALS — BP 139/82 | HR 91 | Ht 64.0 in | Wt 236.0 lb

## 2014-01-20 DIAGNOSIS — Z3201 Encounter for pregnancy test, result positive: Secondary | ICD-10-CM | POA: Insufficient documentation

## 2014-01-20 DIAGNOSIS — N912 Amenorrhea, unspecified: Secondary | ICD-10-CM

## 2014-01-20 LAB — POCT URINE PREGNANCY: Preg Test, Ur: POSITIVE

## 2014-01-20 MED ORDER — CITRANATAL RX 27-1 MG PO TABS: 1.0000 | ORAL_TABLET | Freq: Every day | ORAL | Status: DC

## 2014-01-20 NOTE — Assessment & Plan Note (Signed)
Encouraged well-balanced diet, plenty of rest when needed, pre-natal vitamins daily and walking for exercise. Discussed self-help for nausea, avoiding OTC medications until consulting provider or pharmacist, other than Tylenol as needed, minimal caffeine (1-2 cups daily) and avoiding alcohol. She will schedule her initial OB visit within the next month with her PCP or OB provider. Feel free to call with any questions. U/S ordered.

## 2014-01-20 NOTE — Progress Notes (Signed)
  Subjective:    Cassandra Chandler is a 33 y.o. female who presents for evaluation of amenorrhea. She believes she could be pregnant. Pregnancy is desired. Sexual Activity: Female in monogamous relationship x 10 yrs, FO previous babies.. Current symptoms also include: breast tenderness, positive home pregnancy test and constipation, abdominal fullness. Last period was normal.   Patient's last menstrual period was 10/30/2013. The following portions of the patient's history were reviewed and updated as appropriate: allergies, current medications, past family history, past medical history, past social history, past surgical history and problem list.  Review of Systems Pertinent items are noted in HPI.     Objective:    BP 139/82  Pulse 91  Ht 5\' 4"  (1.626 m)  Wt 236 lb (107.049 kg)  BMI 40.49 kg/m2  LMP 10/30/2013 General: no acute distress    Lab Review Urine HCG: positive    Assessment:    Absence of menstruation.     Plan:    Pregnancy Test: Positive: EDC: 06/30/2014. Briefly discussed pre-natal care options. Pregnancy, Childbirth and the Newborn book given. Encouraged well-balanced diet, plenty of rest when needed, pre-natal vitamins daily and walking for exercise. Discussed self-help for nausea, avoiding OTC medications until consulting provider or pharmacist, other than Tylenol as needed, minimal caffeine (1-2 cups daily) and avoiding alcohol. She will schedule her initial OB visit in the next month with her PCP or OB provider. Feel free to call with any questions.

## 2014-01-20 NOTE — Patient Instructions (Signed)
Encouraged well-balanced diet, plenty of rest when needed, pre-natal vitamins daily and walking for exercise. Discussed self-help for nausea, avoiding OTC medications until consulting provider or pharmacist, other than Tylenol as needed, minimal caffeine (1-2 cups daily) and avoiding alcohol. She will schedule her initial OB visit in the next month with her PCP or OB provider. Feel free to call with any questions.  Pregnancy If you are planning on getting pregnant, it is a good idea to make a preconception appointment with your caregiver to discuss having a healthy lifestyle before getting pregnant. This includes diet, weight, exercise, taking prenatal vitamins (especially folic acid, which helps prevent brain and spinal cord defects), avoiding alcohol, smoking and illegal drugs, medical problems (diabetes, convulsions), family history of genetic problems, working conditions, and immunizations. It is better to have knowledge of these things and do something about them before getting pregnant. During your pregnancy, it is important to follow certain guidelines in order to have a healthy baby. It is very important to get good prenatal care and follow your caregiver's instructions. Prenatal care includes all the medical care you receive before your baby's birth. This helps to prevent problems during the pregnancy and childbirth. HOME CARE INSTRUCTIONS   Start your prenatal visits by the 12th week of pregnancy or earlier, if possible. At first, appointments are usually scheduled monthly. They become more frequent in the last 2 months before delivery. It is important that you keep your caregiver's appointments and follow your caregiver's instructions regarding medication use, exercise, and diet.  During pregnancy, you are providing food for you and your baby. Eat a regular, well-balanced diet. Choose foods such as meat, fish, milk and other dairy products, vegetables, fruits, whole-grain breads and cereals.  Your caregiver will inform you of the ideal weight gain depending on your current height and weight. Drink lots of liquids. Try to drink 8 glasses of water a day.  Alcohol is associated with a number of birth defects including fetal alcohol syndrome. It is best to avoid alcohol completely. Smoking will cause low birth rate and prematurity. Use of alcohol and nicotine during your pregnancy also increases the chances that your child will be chemically dependent later in their life and may contribute to SIDS (Sudden Infant Death Syndrome).  Do not use illegal drugs.  Only take prescription or over-the-counter medications that are recommended by your caregiver. Other medications can cause genetic and physical problems in the baby.  Morning sickness can often be helped by keeping soda crackers at the bedside. Eat a few before getting up in the morning.  A sexual relationship may be continued until near the end of pregnancy if there are no other problems such as early (premature) leaking of amniotic fluid from the membranes, vaginal bleeding, painful intercourse or belly (abdominal) pain.  Exercise regularly. Check with your caregiver if you are unsure of the safety of some of your exercises.  Do not use hot tubs, steam rooms or saunas. These increase the risk of fainting and hurting yourself and the baby. Swimming is OK for exercise. Get plenty of rest, including afternoon naps when possible, especially in the third trimester.  Avoid toxic odors and chemicals.  Do not wear high heels. They may cause you to lose your balance and fall.  Do not lift over 5 pounds. If you do lift anything, lift with your legs and thighs, not your back.  Avoid long trips, especially in the third trimester.  If you have to travel out of the city  or state, take a copy of your medical records with you. SEEK IMMEDIATE MEDICAL CARE IF:   You develop an unexplained oral temperature above 102 F (38.9 C), or as your  caregiver suggests.  You have leaking of fluid from the vagina. If leaking membranes are suspected, take your temperature and inform your caregiver of this when you call.  There is vaginal spotting or bleeding. Notify your caregiver of the amount and how many pads are used.  You continue to feel sick to your stomach (nauseous) and have no relief from remedies suggested, or you throw up (vomit) blood or coffee ground like materials.  You develop upper abdominal pain.  You have round ligament discomfort in the lower abdominal area. This still must be evaluated by your caregiver.  You feel contractions of the uterus.  You do not feel the baby move, or there is less movement than before.  You have painful urination.  You have abnormal vaginal discharge.  You have persistent diarrhea.  You get a severe headache.  You have problems with your vision.  You develop muscle weakness.  You feel dizzy and faint.  You develop shortness of breath.  You develop chest pain.  You have back pain that travels down to your leg and feet.  You feel irregular or a very fast heartbeat.  You develop excessive weight gain in a short period of time (5 pounds in 3 to 5 days).  You are involved in a domestic violence situation. Document Released: 10/16/2005 Document Revised: 04/16/2012 Document Reviewed: 04/09/2009 [facility] Patient Information 2014 [place], [place].

## 2014-01-22 ENCOUNTER — Telehealth: Payer: Self-pay | Admitting: *Deleted

## 2014-01-22 NOTE — Addendum Note (Signed)
Addended by: Jennette BillBUSICK, Emad Brechtel L on: 01/22/2014 01:53 PM   Modules accepted: Orders

## 2014-01-22 NOTE — Telephone Encounter (Signed)
Left message for patient to return call. [facility] at 9:30am. If she needs to reschedule she can call [phone].Alexandrya Chim, Rodena Medinobert Lee

## 2014-01-23 NOTE — Telephone Encounter (Signed)
Pt notified.  Khalea Ventura L, CMA  

## 2014-01-30 ENCOUNTER — Ambulatory Visit (HOSPITAL_COMMUNITY): Payer: 59

## 2014-02-02 ENCOUNTER — Encounter (HOSPITAL_COMMUNITY): Payer: Self-pay

## 2014-02-02 ENCOUNTER — Ambulatory Visit (HOSPITAL_COMMUNITY)
Admission: RE | Admit: 2014-02-02 | Discharge: 2014-02-02 | Disposition: A | Discharge status: Home / Self Care | Payer: 59 | Source: Ambulatory Visit | Attending: Family Medicine | Admitting: Family Medicine

## 2014-02-02 DIAGNOSIS — Z3689 Encounter for other specified antenatal screening: Secondary | ICD-10-CM | POA: Insufficient documentation

## 2014-02-02 DIAGNOSIS — N912 Amenorrhea, unspecified: Secondary | ICD-10-CM

## 2014-02-02 DIAGNOSIS — Z3201 Encounter for pregnancy test, result positive: Secondary | ICD-10-CM

## 2014-03-03 ENCOUNTER — Telehealth: Payer: Self-pay | Admitting: Family Medicine

## 2014-03-03 NOTE — Telephone Encounter (Signed)
Please advise.Thank you.Korissa Horsford S Laneisha Mino  

## 2014-03-03 NOTE — Telephone Encounter (Signed)
Pt has new ob appt in June. She would like to check on baby before then because she is able to sleep on her stomach and there is no more breast tenderness Please advise

## 2014-03-24 ENCOUNTER — Other Ambulatory Visit: Payer: 59

## 2014-03-24 DIAGNOSIS — Z331 Pregnant state, incidental: Secondary | ICD-10-CM

## 2014-03-24 LAB — HIV ANTIBODY (ROUTINE TESTING W REFLEX): HIV 1&2 Ab, 4th Generation: NONREACTIVE

## 2014-03-24 NOTE — Progress Notes (Signed)
NEW OB LABS DONE TODAY Cassandra Chandler 

## 2014-03-25 LAB — OBSTETRIC PANEL
ANTIBODY SCREEN: NEGATIVE
BASOS PCT: 0 % (ref 0–1)
Basophils Absolute: 0 10*3/uL (ref 0.0–0.1)
EOS ABS: 0.1 10*3/uL (ref 0.0–0.7)
EOS PCT: 1 % (ref 0–5)
HEMATOCRIT: 31.6 % — AB (ref 36.0–46.0)
HEMOGLOBIN: 10.6 g/dL — AB (ref 12.0–15.0)
HEP B S AG: NEGATIVE
LYMPHS ABS: 2.7 10*3/uL (ref 0.7–4.0)
Lymphocytes Relative: 35 % (ref 12–46)
MCH: 27.8 pg (ref 26.0–34.0)
MCHC: 33.5 g/dL (ref 30.0–36.0)
MCV: 82.9 fL (ref 78.0–100.0)
MONO ABS: 0.3 10*3/uL (ref 0.1–1.0)
MONOS PCT: 4 % (ref 3–12)
NEUTROS PCT: 60 % (ref 43–77)
Neutro Abs: 4.6 10*3/uL (ref 1.7–7.7)
Platelets: 305 10*3/uL (ref 150–400)
RBC: 3.81 MIL/uL — AB (ref 3.87–5.11)
RDW: 14 % (ref 11.5–15.5)
RH TYPE: POSITIVE
Rubella: 2.01 Index — ABNORMAL HIGH (ref <0.90)
WBC: 7.7 10*3/uL (ref 4.0–10.5)

## 2014-03-25 LAB — CULTURE, OB URINE
Colony Count: NO GROWTH
Organism ID, Bacteria: NO GROWTH

## 2014-03-25 LAB — SICKLE CELL SCREEN: SICKLE CELL SCREEN: NEGATIVE

## 2014-03-31 ENCOUNTER — Other Ambulatory Visit (HOSPITAL_COMMUNITY)
Admission: RE | Admit: 2014-03-31 | Discharge: 2014-03-31 | Disposition: A | Discharge status: Home / Self Care | Payer: 59 | Source: Ambulatory Visit | Attending: Family Medicine | Admitting: Family Medicine

## 2014-03-31 ENCOUNTER — Encounter: Payer: Self-pay | Admitting: Family Medicine

## 2014-03-31 ENCOUNTER — Ambulatory Visit (INDEPENDENT_AMBULATORY_CARE_PROVIDER_SITE_OTHER): Payer: 59 | Admitting: Family Medicine

## 2014-03-31 VITALS — BP 132/72 | HR 88 | Temp 98.1°F | Wt 240.0 lb

## 2014-03-31 DIAGNOSIS — I10 Essential (primary) hypertension: Secondary | ICD-10-CM | POA: Insufficient documentation

## 2014-03-31 DIAGNOSIS — Z98891 History of uterine scar from previous surgery: Secondary | ICD-10-CM | POA: Insufficient documentation

## 2014-03-31 DIAGNOSIS — Z3480 Encounter for supervision of other normal pregnancy, unspecified trimester: Secondary | ICD-10-CM

## 2014-03-31 DIAGNOSIS — Z113 Encounter for screening for infections with a predominantly sexual mode of transmission: Secondary | ICD-10-CM | POA: Insufficient documentation

## 2014-03-31 DIAGNOSIS — R030 Elevated blood-pressure reading, without diagnosis of hypertension: Secondary | ICD-10-CM | POA: Insufficient documentation

## 2014-03-31 NOTE — Progress Notes (Signed)
S: Pt is a 33 y.o. [id number] at [redacted]w[redacted]d by LMP consistent with 13w [place] who presents to clinic for her initial OB visit. She has no current complaints. She has no contractions, bleeding, leakage of fluid. She is not feeling the baby move very much but states she never has. She is not taking prenatal vitamins. She states she did not want to be pregnant at this time, and got pregnant "through OCP's," though admits she did not consistently take her OCP's. She has a history of borderline / "intermittent" HTN but is not on medications, morbid obesity, and C/S x2 (2007, failed induction; 2009, failed TOLAC).  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 I have also reviewed and updated the pt's allergies and current medications as appropriate.  O: See vitals and notes section for fetal data; fundal height and fetal HR both WNL - notable for present but difficult-to-find heart tones BP 132/72  Pulse 88  Temp(Src) 98.1 F (36.7 C)  Wt 240 lb (108.863 kg)  LMP 10/30/2013 Gen: well-appearing adult female in NAD HEENT: [place]/AT, EOMI, PERRLA, MMM Cardio: RRR, no murmur appreciated Pulm: CTAB, no wheezes, normal WOB Abd: soft, morbidly obese, BS+, nontender; gravid Ext: warm, well-perfused, no LE edema GU: external exam with normal vaginal / vulvar structures Speculum exam: small amount of physiologic discharge present in  vaginal vault  - no cervical friability, bleeding, or other lesions noted     A/P: 33 y.o. [id number] at [redacted]w[redacted]d, hx of borderline / "intermittent" HTN not on medications, morbid obesity with C/S x2, doing well - pregnancy medical home form completed and reviewed; no red flags - PHQ-9 reviewed; score 3 (1 point each for questions 3, 4, and 5), marked "not difficult at all" - Pap collected in February so not repeated; GC/Chlamydia specimen collected today - recommended starting prenatal vitamins - too late for genetic screening, scheduled for anatomy scan [place] - plan early Glucola at next visit given obesity and hx of two post-term deliveries with failed inductions - reviewed second trimester expectations and preterm precautions as well as instructions on when to call clinic or present to MAU - f/u in 2 weeks in OB clinic at [facility] for second trimester visit, then with me after that - will need visit with Dr. Shawnie Pons around 30 weeks to discuss birth plan / C/S with tubal - pt seen in conjunction with K. Chanetta Marshall, MS4; the above reflects HPI obtained with medical student but with my independent exam and assessment/plan  Bobbye Morton, MD PGY-2, [facility] 03/31/2014, 8:37 PM

## 2014-03-31 NOTE — Assessment & Plan Note (Signed)
X2 (2007, failed induction at 42 weeks; 2009, failed induction / TOLAC at 42 weeks)

## 2014-03-31 NOTE — Patient Instructions (Addendum)
Front desk: Please schedule Ms Montone with OB clinic here in 2 weeks. She will need an early Glucola test at that time.  Thank you for coming in, today!  Everything on your labs looks fine. I do recommend that you start prenatal vitamins, once a day. I will call or send you a letter with your results from today. Your ultrasound has been scheduled at [facility].  Come back to see the [facility] clinic (here in this building) in 2 weeks. They will do a 1-hour sugar test at that time. We will probably repeat that at around 26 weeks, as well. After that, they'll send you back to see me for your next visit.  If you have any of the following, call here or go straight to [facility] admissions unit) - severe headache that Tylenol does not help - change in vision (spots, blurriness, flashes, etc) - severe abdominal pain (especially under your right ribs) or cramping / contractions - bleeding or discharge or a gush or trickle of fluids from your vagina - severe lower extremity swelling, especially over a short period of time - any other "sick" symptoms (such as bad fever / chills, nausea / vomiting, etc)  Please feel free to call with any questions or concerns at any time, at [phone]. --Dr. Casper Harrison  Second Trimester of Pregnancy The second trimester is from week 13 through week 28, month 4 through 6. This is often the time in pregnancy that you feel your best. Often times, morning sickness has lessened or quit. You may have more energy, and you may get hungry more often. Your unborn baby (fetus) is growing rapidly. At the end of the sixth month, he or she is about 9 inches long and weighs about 1 pounds. You will likely feel the baby move (quickening) between 18 and 20 weeks of pregnancy. HOME CARE   Avoid all smoking, herbs, and alcohol. Avoid drugs not approved by your doctor.  Only take medicine as told by your doctor. Some medicines are safe and some are not during  pregnancy.  Exercise only as told by your doctor. Stop exercising if you start having cramps.  Eat regular, healthy meals.  Wear a good support bra if your breasts are tender.  Do not use hot tubs, steam rooms, or saunas.  Wear your seat belt when driving.  Avoid raw meat, uncooked cheese, and liter boxes and soil used by cats.  Take your prenatal vitamins.  Try taking medicine that helps you poop (stool softener) as needed, and if your doctor approves. Eat more fiber by eating fresh fruit, vegetables, and whole grains. Drink enough fluids to keep your pee (urine) clear or pale yellow.  Take warm water baths (sitz baths) to soothe pain or discomfort caused by hemorrhoids. Use hemorrhoid cream if your doctor approves.  If you have puffy, bulging veins (varicose veins), wear support hose. Raise (elevate) your feet for 15 minutes, 3 4 times a day. Limit salt in your diet.  Avoid heavy lifting, wear low heals, and sit up straight.  Rest with your legs raised if you have leg cramps or low back pain.  Visit your dentist if you have not gone during your pregnancy. Use a soft toothbrush to brush your teeth. Be gentle when you floss.  You can have sex (intercourse) unless your doctor tells you not to.  Go to your doctor visits. GET HELP IF:   You feel dizzy.  You have mild cramps or pressure in  your lower belly (abdomen).  You have a nagging pain in your belly area.  You continue to feel sick to your stomach (nauseous), throw up (vomit), or have watery poop (diarrhea).  You have bad smelling fluid coming from your vagina.  You have pain with peeing (urination). GET HELP RIGHT AWAY IF:   You have a fever.  You are leaking fluid from your vagina.  You have spotting or bleeding from your vagina.  You have severe belly cramping or pain.  You lose or gain weight rapidly.  You have trouble catching your breath and have chest pain.  You notice sudden or extreme puffiness  (swelling) of your face, hands, ankles, feet, or legs.  You have not felt the baby move in over an hour.  You have severe headaches that do not go away with medicine.  You have vision changes. Document Released: 01/10/2010 Document Revised: 02/10/2013 Document Reviewed: 12/17/2012 [facility] Patient Information 2014 [place]ExitCare, [place]LL[place].

## 2014-03-31 NOTE — Assessment & Plan Note (Signed)
See vitals and notes section for specific details from today's visit. A1P3790 at [redacted]w[redacted]d doing well. Early Glucola next visit. Scheduled for anatomy US. Needs 30 week visit with Dr. Shawnie Pons for birth planning given hx of C/S x2.

## 2014-04-01 ENCOUNTER — Encounter: Payer: Self-pay | Admitting: Family Medicine

## 2014-04-06 ENCOUNTER — Other Ambulatory Visit: Payer: Self-pay | Admitting: Family Medicine

## 2014-04-06 ENCOUNTER — Ambulatory Visit (HOSPITAL_COMMUNITY)
Admission: RE | Admit: 2014-04-06 | Discharge: 2014-04-06 | Disposition: A | Discharge status: Home / Self Care | Payer: 59 | Source: Ambulatory Visit | Attending: Family Medicine | Admitting: Family Medicine

## 2014-04-06 DIAGNOSIS — Z3689 Encounter for other specified antenatal screening: Secondary | ICD-10-CM | POA: Insufficient documentation

## 2014-04-06 DIAGNOSIS — Z3480 Encounter for supervision of other normal pregnancy, unspecified trimester: Secondary | ICD-10-CM

## 2014-04-14 ENCOUNTER — Ambulatory Visit (INDEPENDENT_AMBULATORY_CARE_PROVIDER_SITE_OTHER): Payer: 59 | Admitting: Family Medicine

## 2014-04-14 VITALS — BP 122/68 | HR 91 | Temp 98.1°F | Wt 235.5 lb

## 2014-04-14 DIAGNOSIS — Z3480 Encounter for supervision of other normal pregnancy, unspecified trimester: Secondary | ICD-10-CM

## 2014-04-14 LAB — GLUCOSE, CAPILLARY: Glucose-Capillary: 126 mg/dL — ABNORMAL HIGH (ref 70–99)

## 2014-04-14 NOTE — Progress Notes (Signed)
S: Pt is a 33 y.o. [id number] at 3652w5d by LMP consistent with early US who presents to clinic for routine OB follow-up. She is compliant with prental vitamins. She has no bleeding, LOF, or discharge. She has no contractions. She endorses good fetal movements. She has been given Glucola for a 1-hour GTT this morning. She denies pain, swelling, N/V, headache.  O: Fundal height 25 cm, FHT 140's by doppler  BP 122/68  Pulse 91  Temp(Src) 98.1 F (36.7 C)  Wt 235 lb 8 oz (106.822 kg)  LMP 10/30/2013 Gen: adult female in NAD Cardio: RRR, no murmur appreciated Pulm: CTAB, no wheezes Abd: soft, nontender, BS+; gravid in appearance Ext: warm, well-perfused, no LE edema  A/P:  33 y.o. at 9352w5d by LMP, hx of C/S x2 and borderline HTN not on medications; doing well without complaints - early Glucola today normal (126) - needs repeat/follow-up ultrasound around 29-30w  -18w US for anatomy was incomplete due to positioning / body habitus  -needs to be scheduled at next visit - otherwise continue PNV, reviewed second trimester expectations and preterm precautions  - reviewed instructions on when to call clinic or present to [facility]  - [facility], then with me after that  - will need visit with Dr. Shawnie PonsPratt around 30 weeks to discuss birth plan / C/S with tubal  Bobbye Mortonhristopher M Street, MD PGY-2, [facility] 04/14/2014, 11:12 AM Note: pt seen in conjunction with K. Chanetta Marshallimberlake, MS4; the above reflects HPI obtained with medical student but with my independent exam and assessment/plan

## 2014-04-14 NOTE — Patient Instructions (Signed)
Front office: Please [name]  Thank you for coming in, today!  Everything looks good, today. We will do your sugar test today. If it is abnormally high, they'll schedule you to do a 3-hour test. If that second test is necessary and also abnormally high, we'll [facility] for blood sugar management in pregnancy. If everything is normal, you'll keep following up here without any changes.  We'll repeat some labwork and paperwork at your next visit. We will repeat your ultrasound in about 8 weeks.  Please feel free to call with any questions or concerns at any time, at [phone]. --Dr. Casper HarrisonStreet  Second Trimester of Pregnancy The second trimester is from week 13 through week 28, month 4 through 6. This is often the time in pregnancy that you feel your best. Often times, morning sickness has lessened or quit. You may have more energy, and you may get hungry more often. Your unborn baby (fetus) is growing rapidly. At the end of the sixth month, he or she is about 9 inches long and weighs about 1 pounds. You will likely feel the baby move (quickening) between 18 and 20 weeks of pregnancy. HOME CARE   Avoid all smoking, herbs, and alcohol. Avoid drugs not approved by your doctor.  Only take medicine as told by your doctor. Some medicines are safe and some are not during pregnancy.  Exercise only as told by your doctor. Stop exercising if you start having cramps.  Eat regular, healthy meals.  Wear a good support bra if your breasts are tender.  Do not use hot tubs, steam rooms, or saunas.  Wear your seat belt when driving.  Avoid raw meat, uncooked cheese, and liter boxes and soil used by cats.  Take your prenatal vitamins.  Try taking medicine that helps you poop (stool softener) as needed, and if your doctor approves. Eat more fiber by eating fresh fruit, vegetables, and whole grains. Drink enough fluids  to keep your pee (urine) clear or pale yellow.  Take warm water baths (sitz baths) to soothe pain or discomfort caused by hemorrhoids. Use hemorrhoid cream if your doctor approves.  If you have puffy, bulging veins (varicose veins), wear support hose. Raise (elevate) your feet for 15 minutes, 3 4 times a day. Limit salt in your diet.  Avoid heavy lifting, wear low heals, and sit up straight.  Rest with your legs raised if you have leg cramps or low back pain.  Visit your dentist if you have not gone during your pregnancy. Use a soft toothbrush to brush your teeth. Be gentle when you floss.  You can have sex (intercourse) unless your doctor tells you not to.  Go to your doctor visits. GET HELP IF:   You feel dizzy.  You have mild cramps or pressure in your lower belly (abdomen).  You have a nagging pain in your belly area.  You continue to feel sick to your stomach (nauseous), throw up (vomit), or have watery poop (diarrhea).  You have bad smelling fluid coming from your vagina.  You have pain with peeing (urination). GET HELP RIGHT AWAY IF:   You have a fever.  You are leaking fluid from your vagina.  You have spotting or bleeding from your vagina.  You have severe belly cramping or pain.  You lose or gain weight rapidly.  You have trouble catching your breath and have chest pain.  You  notice sudden or extreme puffiness (swelling) of your face, hands, ankles, feet, or legs.  You have not felt the baby move in over an hour.  You have severe headaches that do not go away with medicine.  You have vision changes. Document Released: 01/10/2010 Document Revised: 02/10/2013 Document Reviewed: 12/17/2012 [facility] Patient Information 2014 [place]ExitCare, [place]LLC.

## 2014-04-14 NOTE — Progress Notes (Signed)
FMTS Attending Note  I agree with the assessment and plan as documented by the resident.  Donnella ShamKyle Fletke MD

## 2014-04-14 NOTE — Assessment & Plan Note (Signed)
See vitals and notes section for specific details. [id number] at 2436w5d by LMP doing well. Early glucola normal. Needs repeat US around 29-20 weeks; schedule at next visit (18w US for anatomy limited by positioning / body habitus). Needs 30 week visit with Dr. Shawnie PonsPratt for birth planning given hx of C/S x2.

## 2014-05-07 ENCOUNTER — Ambulatory Visit (INDEPENDENT_AMBULATORY_CARE_PROVIDER_SITE_OTHER): Payer: 59 | Admitting: Family Medicine

## 2014-05-07 ENCOUNTER — Encounter: Payer: Self-pay | Admitting: Family Medicine

## 2014-05-07 VITALS — BP 112/78 | HR 76 | Temp 98.1°F | Wt 235.8 lb

## 2014-05-07 DIAGNOSIS — Z23 Encounter for immunization: Secondary | ICD-10-CM

## 2014-05-07 DIAGNOSIS — Z3480 Encounter for supervision of other normal pregnancy, unspecified trimester: Secondary | ICD-10-CM

## 2014-05-07 DIAGNOSIS — Z3482 Encounter for supervision of other normal pregnancy, second trimester: Secondary | ICD-10-CM

## 2014-05-07 LAB — GLUCOSE, CAPILLARY
Comment 1: 1
Glucose-Capillary: 174 mg/dL — ABNORMAL HIGH (ref 70–99)

## 2014-05-07 NOTE — Patient Instructions (Signed)
It was nice seeing you today Cassandra Chandler, I am glad your pregnancy is progressing well. We repeated your glucose test today, we will call you with result. Please remember to schedule U/S with you PCP at your next visit. You should f/u in 2 wks. Call if you have any concern before then.

## 2014-05-07 NOTE — Addendum Note (Signed)
Addended by: Garen GramsBENTON, Tiasha Helvie F on: 05/07/2014 10:14 AM   Modules accepted: Orders

## 2014-05-07 NOTE — Progress Notes (Signed)
32 Y/O [id number] @ 6427 W here for routine prenatal care,no major concern today. Pregnancy is progressing well, denies any leg swelling, appetite is same, she has not been taking her PNV,denies vaginal discharge or bleeding, occasional spurious contraction.No N/V.  Exam: Check flow sheet about. CV/Resp: S1 S2,no murmurs. RRR. Air entry equal and clear B/L Abd: Gravid, NT. FH as documented in flow sheet. Fetal heart movement visualized by bedside U/S and presentation was vertex. Ext: No edema.  A/P: Routine prenatal care, pregnancy progressing normally.        Counseling done on spurious contraction and use of PNV which she will obtain OTC.        Tdap offered today.        1 hr glucola repeated although early glucola was recently done.        Schedule follow up anatomy U/S at next visit with PCP.         Schedule f/u with Dr Shawnie PonsPratt to discuss C/S and tubal ligation.         BF discussed, plan to BF or bottle feed.         F/U in 2wks with PCP at that time she will also need 2nd trimester lab (CBC,RPR and HIV)

## 2014-05-12 ENCOUNTER — Other Ambulatory Visit (INDEPENDENT_AMBULATORY_CARE_PROVIDER_SITE_OTHER): Payer: 59

## 2014-05-12 DIAGNOSIS — Z331 Pregnant state, incidental: Secondary | ICD-10-CM

## 2014-05-12 LAB — GLUCOSE, CAPILLARY
Comment 1: 1
GLUCOSE-CAPILLARY: 92 mg/dL (ref 70–99)

## 2014-05-12 NOTE — Progress Notes (Signed)
3 HR GTT DONE TODAY Cassandra Chandler 

## 2014-05-13 LAB — GLUCOSE TOLERANCE, 3 HOURS
GLUCOSE, 1 HOUR-GESTATIONAL: 146 mg/dL (ref 70–189)
GLUCOSE, 2 HOUR-GESTATIONAL: 107 mg/dL (ref 70–164)
GLUCOSE, FASTING-GESTATIONAL: 89 mg/dL (ref 70–104)
Glucose, GTT - 3 Hour: 101 mg/dL (ref 70–144)

## 2014-05-21 ENCOUNTER — Ambulatory Visit (INDEPENDENT_AMBULATORY_CARE_PROVIDER_SITE_OTHER): Payer: 59 | Admitting: Family Medicine

## 2014-05-21 VITALS — BP 120/78 | HR 91 | Wt 234.0 lb

## 2014-05-21 DIAGNOSIS — Z3482 Encounter for supervision of other normal pregnancy, second trimester: Secondary | ICD-10-CM

## 2014-05-21 DIAGNOSIS — Z3480 Encounter for supervision of other normal pregnancy, unspecified trimester: Secondary | ICD-10-CM

## 2014-05-21 LAB — CBC
HEMATOCRIT: 31.3 % — AB (ref 36.0–46.0)
HEMOGLOBIN: 10.6 g/dL — AB (ref 12.0–15.0)
MCH: 27.9 pg (ref 26.0–34.0)
MCHC: 33.9 g/dL (ref 30.0–36.0)
MCV: 82.4 fL (ref 78.0–100.0)
Platelets: 304 10*3/uL (ref 150–400)
RBC: 3.8 MIL/uL — ABNORMAL LOW (ref 3.87–5.11)
RDW: 13.7 % (ref 11.5–15.5)
WBC: 5.7 10*3/uL (ref 4.0–10.5)

## 2014-05-21 NOTE — Progress Notes (Signed)
S: Pt is a 33 y.o. [id number] at 3764w0d by LMP consistent with early US who presents to clinic for routine OB follow-up. She has no specific complaints and reports she is taking prenatal vitamins. She has no bleeding, LOF, or discharge. She has occasional uterine contractions (irregular, brief, more like irritability). She had a recent elevated 1-h GTT but normal 3-h. She has had two C-sections in the past and wants a tubal ligation, this time. She feels well, otherwise, without fever, chills, N/V, headache, or LE swelling.  O: See vitals and notes for fetal data. BP 120/78  Pulse 91  Wt 234 lb (106.142 kg)  LMP 10/30/2013 Gen: well-appearing, in NAD Cardio: RRR, no murmur appreciated Pulm: CTAB, no wheezes, normal WOB Abd: soft, nontender; obese; gravid in contour; BS+ Ext: warm, well-perfused, no LE edema  A/P: 33 y.o. [id number] at 264w0d, hx of C/S x2 and borderline HTN not on medications; doing well without complaints  - repeat HIV, RPR, and CBC - 18w US for anatomy was incomplete due to positioning / body habitus --> repeat ordered - otherwise continue PNV, reviewed second trimester expectations and preterm precautions  - reviewed instructions on when to call clinic or present to [facility]U  - f/u in 2 weeks with me in 2 weeks, and with Dr. Shawnie PonsPratt next available to discuss birth plan / possible repeat C/S with tubal  Bobbye Mortonhristopher M Gearl Kimbrough, MD PGY-3, [facility] 05/21/2014, 10:37 AM

## 2014-05-21 NOTE — Patient Instructions (Addendum)
Front office: Please schedule patient with me in 2 weeks, and with Dr. Shawnie PonsPratt at her next available appointment to discuss possible repeat C-section and tubal ligation  Thank you for coming in, today!  Everything looks and sounds fine. We'll repeat some labs, today. If anything is abnormal I'll call you. Otherwise, I'll send a letter.  Keep taking your prenatal vitamin. If you ever have problems with nausea, call me and I'll give you a medication for that.  Come back to see me in 2 weeks and we'll schedule you to see Dr. Shawnie PonsPratt to talk about c-section vs vaginal birth, and your tubal ligation. Otherwise, come back to see me or Dr. Jennette KettleNeal as you need. Please feel free to call with any questions or concerns at any time, at [phone]. --Dr. Casper HarrisonStreet

## 2014-05-21 NOTE — Assessment & Plan Note (Signed)
See vitals and notes section for specific details. 32 y.o. [id number] at 3197w0d by LMP doing well. 3-h GTT normal. Repeat US ordered (18w anatomy scan incomplete). F/u with Dr. Shawnie PonsPratt for birth planning -- possible repeat C/S with tubal. F/u with me in 2 weeks.

## 2014-05-22 ENCOUNTER — Encounter: Payer: Self-pay | Admitting: Family Medicine

## 2014-05-22 LAB — RPR

## 2014-05-22 LAB — HIV ANTIBODY (ROUTINE TESTING W REFLEX): HIV: NONREACTIVE

## 2014-05-25 ENCOUNTER — Ambulatory Visit (HOSPITAL_COMMUNITY)
Admission: RE | Admit: 2014-05-25 | Discharge: 2014-05-25 | Disposition: A | Discharge status: Home / Self Care | Payer: 59 | Source: Ambulatory Visit | Attending: Family Medicine | Admitting: Family Medicine

## 2014-05-25 DIAGNOSIS — Z3689 Encounter for other specified antenatal screening: Secondary | ICD-10-CM | POA: Diagnosis present

## 2014-05-25 DIAGNOSIS — Z3482 Encounter for supervision of other normal pregnancy, second trimester: Secondary | ICD-10-CM

## 2014-06-04 ENCOUNTER — Ambulatory Visit (INDEPENDENT_AMBULATORY_CARE_PROVIDER_SITE_OTHER): Payer: 59 | Admitting: Family Medicine

## 2014-06-04 VITALS — BP 122/76 | HR 81 | Temp 98.1°F | Wt 237.0 lb

## 2014-06-04 DIAGNOSIS — R030 Elevated blood-pressure reading, without diagnosis of hypertension: Secondary | ICD-10-CM

## 2014-06-04 DIAGNOSIS — Z3483 Encounter for supervision of other normal pregnancy, third trimester: Secondary | ICD-10-CM

## 2014-06-04 DIAGNOSIS — Z9889 Other specified postprocedural states: Secondary | ICD-10-CM

## 2014-06-04 DIAGNOSIS — Z98891 History of uterine scar from previous surgery: Secondary | ICD-10-CM

## 2014-06-04 DIAGNOSIS — Z3480 Encounter for supervision of other normal pregnancy, unspecified trimester: Secondary | ICD-10-CM

## 2014-06-04 NOTE — Assessment & Plan Note (Signed)
x2 (2007 and 2009, both failed inductions). F/u with Dr. Shawnie PonsPratt. Also desires tubal this time.

## 2014-06-04 NOTE — Patient Instructions (Signed)
Thank you for coming in, today!  We'll watch your blood pressure closely. Today it was 139/86 on the machine but it was 122/76 when I rechecked it manually.  Make sure you keep your appointment next week with Dr. Shawnie Pons. Come back to see me in about 3 weeks, after that. If you have bad headaches, swelling, changes in your vision, bleeding, leakage of fluids, or contractions, call us or go straight to the MAU. Please feel free to call with any questions or concerns at any time, at [phone]. --Dr. Casper Harrison  Third Trimester of Pregnancy The third trimester is from week 29 through week 42, months 7 through 9. This trimester is when your unborn baby (fetus) is growing very fast. At the end of the ninth month, the unborn baby is about 20 inches in length. It weighs about 6-10 pounds.  HOME CARE   Avoid all smoking, herbs, and alcohol. Avoid drugs not approved by your doctor.  Only take medicine as told by your doctor. Some medicines are safe and some are not during pregnancy.  Exercise only as told by your doctor. Stop exercising if you start having cramps.  Eat regular, healthy meals.  Wear a good support bra if your breasts are tender.  Do not use hot tubs, steam rooms, or saunas.  Wear your seat belt when driving.  Avoid raw meat, uncooked cheese, and liter boxes and soil used by cats.  Take your prenatal vitamins.  Try taking medicine that helps you poop (stool softener) as needed, and if your doctor approves. Eat more fiber by eating fresh fruit, vegetables, and whole grains. Drink enough fluids to keep your pee (urine) clear or pale yellow.  Take warm water baths (sitz baths) to soothe pain or discomfort caused by hemorrhoids. Use hemorrhoid cream if your doctor approves.  If you have puffy, bulging veins (varicose veins), wear support hose. Raise (elevate) your feet for 15 minutes, 3-4 times a day. Limit salt in your diet.  Avoid heavy lifting, wear low heels, and sit up  straight.  Rest with your legs raised if you have leg cramps or low back pain.  Visit your dentist if you have not gone during your pregnancy. Use a soft toothbrush to brush your teeth. Be gentle when you floss.  You can have sex (intercourse) unless your doctor tells you not to.  Do not travel far distances unless you must. Only do so with your doctor's approval.  Take prenatal classes.  Practice driving to the hospital.  Pack your hospital bag.  Prepare the baby's room.  Go to your doctor visits. GET HELP IF:  You are not sure if you are in labor or if your water has broken.  You are dizzy.  You have mild cramps or pressure in your lower belly (abdominal).  You have a nagging pain in your belly area.  You continue to feel sick to your stomach (nauseous), throw up (vomit), or have watery poop (diarrhea).  You have bad smelling fluid coming from your vagina.  You have pain with peeing (urination). GET HELP RIGHT AWAY IF:   You have a fever.  You are leaking fluid from your vagina.  You are spotting or bleeding from your vagina.  You have severe belly cramping or pain.  You lose or gain weight rapidly.  You have trouble catching your breath and have chest pain.  You notice sudden or extreme puffiness (swelling) of your face, hands, ankles, feet, or legs.  You have not  felt the baby move in over an hour.  You have severe headaches that do not go away with medicine.  You have vision changes. Document Released: 01/10/2010 Document Revised: 02/10/2013 Document Reviewed: 12/17/2012 [facility] Patient Information 2015 [place]ExitCare, [place]LLC. This information is not intended to replace advice given to you by your health care provider. Make sure you discuss any questions you have with your health care provider.  Preterm Labor Information Preterm labor is when labor starts before you are [redacted] weeks pregnant. The normal length of pregnancy is 39 to 41 weeks.  CAUSES  The cause  of preterm labor is not often known. The most common known cause is infection. RISK FACTORS  Having a history of preterm labor.  Having your water break before it should.  Having a placenta that covers the opening of the cervix.  Having a placenta that breaks away from the uterus.  Having a cervix that is too weak to hold the baby in the uterus.  Having too much fluid in the amniotic sac.  Taking drugs or smoking while pregnant.  Not gaining enough weight while pregnant.  Being younger than 6418 and older than 31 years old.  Having a low income.  Being African American. SYMPTOMS  Period-like cramps, belly (abdominal) pain, or back pain.  Contractions that are regular, as often as six in an hour. They may be mild or painful.  Contractions that start at the top of the belly. They then move to the lower belly and back.  Lower belly pressure that seems to get stronger.  Bleeding from the vagina.  Fluid leaking from the vagina. TREATMENT  Treatment depends on:  Your condition.  The condition of your baby.  How many weeks pregnant you are. Your doctor may have you:  Take medicine to stop contractions.  Stay in bed except to use the restroom (bed rest).  Stay in the hospital. WHAT SHOULD YOU DO IF YOU THINK YOU ARE IN PRETERM LABOR? Call your doctor right away. You need to go to the hospital right away.  HOW CAN YOU PREVENT PRETERM LABOR IN FUTURE PREGNANCIES?  Stop smoking, if you smoke.  Maintain healthy weight gain.  Do not take drugs or be around chemicals that are not needed.  Tell your doctor if you think you have an infection.  Tell your doctor if you had a preterm labor before. Document Released: 01/12/2009 Document Revised: 08/06/2013 Document Reviewed: 01/12/2009 [facility] Patient Information 2015 [place]ExitCare, MarylandLLC. This information is not intended to replace advice given to you by your health care provider. Make sure you discuss any questions you have  with your health care provider.

## 2014-06-04 NOTE — Assessment & Plan Note (Signed)
BP elevated in the past but inconsistently. Has been normal so far through pregnancy. One reading elevated today, but manual recheck normal. Planning to monitor carefully. If persists or continues to increase, will need to discuss with attending / OB providers to [facility], though I would remain involved with prenatal care / delivery if this is necessary.

## 2014-06-04 NOTE — Progress Notes (Signed)
Initial Dynamap BP 139/86. Manual repeat BP 122/76. See full prog note.

## 2014-06-04 NOTE — Assessment & Plan Note (Addendum)
See vitals and notes for specific details. 32 y.o. [id number] at 3530w0d by LMP c/w early US doing well. BP slightly elevated initially but normal on recheck; monitoring closely given hx of borderline-elevated BP's, though no symptoms of pre-E to speak of, currently. Consider repeat US to complete anatomy scan, but may continue to be limited due to obesity. F/u with Dr. Shawnie PonsPratt for birth planning, likely repeat C/S with tubal ligation. F/u with me in about 3 weeks.

## 2014-06-04 NOTE — Progress Notes (Signed)
S: Pt is a 33 y.o. [id number] at 3477w0d by LMP consistent with early US who presents to clinic for routine OB follow-up. She has no specific complaints and reports she is taking prenatal vitamins. She has no bleeding, LOF, or discharge. She has had no contractions. She has had two C-sections in the past and wants to repeat, and thinks she wants a tubal ligation, this time. She has an appointment for birth planning with Dr. Shawnie PonsPratt next week. She feels well, otherwise, without fever, chills, N/V, headache, or LE swelling. She has had no changes in her vision or RUQ pain, either; her BP was initially high today on first check but she felt well without symptoms as stated above, and has never been on BP meds but has been on the "borderline" several times in the last few years.  O: See vitals and notes for fetal data.  BP 122/76  Pulse 81  Temp(Src) 98.1 F (36.7 C)  Wt 237 lb (107.502 kg)  LMP 10/30/2013 Initial BP 139/86 (machine), manual recheck 122/76 Gen: well-appearing, in NAD  Cardio: RRR, no murmur appreciated  Pulm: CTAB, no wheezes, normal WOB  Abd: soft, nontender; obese; gravid in contour; BS+  Ext: warm, well-perfused, no LE edema   A/P:  33 y.o. [id number] at 7877w0d, hx of C/S x2 and borderline HTN not on medications; doing well without complaints  - BP elevated today but normal on manual recheck -- will need to follow closely; reviewed signs / symptoms that would necessitate a clinic call or MAU visit - 18w US for anatomy and repeat scan both grossly normal but slightly limited due to body habitus, will consider repeat in a month or so - otherwise continue PNV, reviewed third trimester expectations and preterm precautions  - f/u with Dr. Shawnie PonsPratt next available to discuss birth plan / possible repeat C/S with tubal, then with me in about 3 weeks  Bobbye Mortonhristopher M Sakiyah Shur, MD PGY-3, [facility] 06/04/2014, 11:32 PM

## 2014-06-08 ENCOUNTER — Encounter: Payer: Self-pay | Admitting: Family Medicine

## 2014-06-08 ENCOUNTER — Ambulatory Visit (INDEPENDENT_AMBULATORY_CARE_PROVIDER_SITE_OTHER): Payer: 59 | Admitting: Family Medicine

## 2014-06-08 VITALS — BP 125/81 | HR 90 | Temp 97.8°F | Wt 236.0 lb

## 2014-06-08 DIAGNOSIS — O34219 Maternal care for unspecified type scar from previous cesarean delivery: Secondary | ICD-10-CM

## 2014-06-08 NOTE — Patient Instructions (Signed)
Cesarean Delivery  Cesarean delivery is the birth of a baby through a cut (incision) in the abdomen and womb (uterus).  LET YOUR HEALTH CARE PROVIDER KNOW ABOUT:  All medicines you are taking, including vitamins, herbs, eye drops, creams, and over-the-counter medicines.  Previous problems you or members of your family have had with the use of anesthetics.  Any blood disorders you have.  Previous surgeries you have had.  Medical conditions you have.  Any allergies you have.  Complicationsinvolving the pregnancy. RISKS AND COMPLICATIONS  Generally, this is a safe procedure. However, as with any procedure, complications can occur. Possible complications include:  Bleeding.  Infection.  Blood clots.  Injury to surrounding organs.  Problems with anesthesia.  Injury to the baby. BEFORE THE PROCEDURE   You may be given an antacid medicine to drink. This will prevent acid contents in your stomach from going into your lungs if you vomit during the surgery.  You may be given an antibiotic medicine to prevent infection. PROCEDURE   Hair may be removed from your pubic area and your lower abdomen. This is to prevent infection in the incision site.  A tube (Foley catheter) will be placed in your bladder to drain your urine from your bladder into a bag. This keeps your bladder empty during surgery.  An IV tube will be placed in your vein.  You may be given medicine to numb the lower half of your body (regional anesthetic). If you were in labor, you may have already had an epidural in place which can be used in both labor and cesarean delivery. You may possibly be given medicine to make you sleep (general anesthetic) though this is not as common.  An incision will be made in your abdomen that extends to your uterus. There are 2 basic kinds of incisions:  The horizontal (transverse) incision. Horizontal incisions are from side to side and are used for most routine cesarean  deliveries.  The vertical incision. The vertical incision is from the top of the abdomen to the bottom and is less commonly used. It is often done for women who have a serious complication (extreme prematurity) or under emergency situations.  The horizontal and vertical incisions may both be used at the same time. However, this is very uncommon.  An incision is then made in your uterus to deliver the baby.  Your baby will then be delivered.  Both incisions are then closed with absorbable stitches. AFTER THE PROCEDURE   If you were awake during the surgery, you will see your baby right away. If you were asleep, you will see your baby as soon as you are awake.  You may breastfeed your baby after surgery.  You may be able to get up and walk the same day as the surgery. If you need to stay in bed for a period of time, you will receive help to turn, cough, and take deep breaths after surgery. This helps prevent lung problems such as pneumonia.  Do not get out of bed alone the first time after surgery. You will need help getting out of bed until you are able to do this by yourself.  You may be able to shower the day after your cesarean delivery. After the bandage (dressing) is taken off the incision site, a nurse will assist you to shower if you would like help.  You will have pneumatic compression hose placed on your lower legs. This is done to prevent blood clots. When you are up   and walking regularly, they will no longer be necessary.  Do not cross your legs when you sit.  Save any blood clots that you pass. If you pass a clot while on the toilet, do not flush it. Call for the nurse. Tell the nurse if you think you are bleeding too much or passing too many clots.  You will be given medicine as needed. Let your health care providers know if you are hurting. You may also be given an antibiotic to prevent an infection.  Your IV tube will be taken out when you are drinking a reasonable  amount of fluids. The Foley catheter is taken out when you are up and walking.  If your blood type is Rh negative and your baby's blood type is Rh positive, you will be given a shot of anti-D immune globulin. This shot prevents you from having Rh problems with a future pregnancy. You should get the shot even if you had your tubes tied (tubal ligation).  If you are allowed to take the baby for a walk, place the baby in the bassinet and push it. Do not carry your baby in your arms. Document Released: 10/16/2005 Document Revised: 08/06/2013 Document Reviewed: 05/07/2013 ExitCare Patient Information 2015 [employer]. This information is not intended to replace advice given to you by your health care provider. Make sure you discuss any questions you have with your health care provider.  

## 2014-06-08 NOTE — Progress Notes (Signed)
  [facility] Attending Consult Note  33 y.o. [id number] at 5262w4d with Estimated Date of Delivery: 08/06/14 was seen today in office to discuss trial of labor after cesarean section (TOLAC) versus elective repeat cesarean delivery (ERCD). The following risks were discussed with the patient.  Risk of uterine rupture at term is 0.78 percent with TOLAC but higher with 2 prior C-sections and 0.22 percent with ERCD. 1 in 10 uterine ruptures will result in neonatal death or neurological injury. The benefits of a trial of labor after cesarean (TOLAC) resulting in a vaginal birth after cesarean (VBAC) include the following: shorter length of hospital stay and postpartum recovery (in most cases); fewer complications, such as postpartum fever, wound or uterine infection, thromboembolism (blood clots in the leg or lung), need for blood transfusion and fewer neonatal breathing problems. The risks of an attempted VBAC or TOLAC include the following: Risk of failed trial of labor after cesarean (TOLAC) without a vaginal birth after cesarean (VBAC) resulting in repeat cesarean delivery (RCD) in about 20 to 40 percent of women who attempt VBAC.  Risk of rupture of uterus resulting in an emergency cesarean delivery. The risk of uterine rupture may be related in part to the type of uterine incision made during the first cesarean delivery. A previous transverse uterine incision has the lowest risk of rupture (0.2 to 1.5 percent risk). Vertical or T-shaped uterine incisions have a higher risk of uterine rupture (4 to 9 percent risk)The risk of fetal death is very low with both VBAC and elective repeat cesarean delivery (ERCD), but the likelihood of fetal death is higher with VBAC than with ERCD. Maternal death is very rare with either type of delivery. The risks of an elective repeat cesarean delivery (ERCD) were reviewed with the patient including but not limited to: 11/998 risk of uterine rupture which could have  serious consequences, bleeding which may require transfusion; infection which may require antibiotics; injury to bowel, bladder or other surrounding organs (bowel, bladder, ureters); injury to the fetus; need for additional procedures including hysterectomy in the event of a life-threatening hemorrhage; thromboembolic phenomenon; abnormal placentation; incisional problems; death and other postoperative or anesthesia complications.    These risks and benefits are summarized on the consent form, which was reviewed with the patient during the visit.  All her questions answered and she signed a consent indicating a preference for ERCD. A copy of the consent was given to the patient.  Patient also desires bilateral tubal sterilization (BTS) at the time of RCD, also discussed the additional risks of regret, failure rate of 0.5-1%  with increased risk of ectopic gestation and permanent and irreversible nature of the procedure.  Other forms of reversible BCM discussed, also discussed vasectomy, patient desires BTS.  [facility].

## 2014-06-22 ENCOUNTER — Encounter: Payer: Self-pay | Admitting: Family Medicine

## 2014-06-22 ENCOUNTER — Ambulatory Visit (INDEPENDENT_AMBULATORY_CARE_PROVIDER_SITE_OTHER): Payer: 59 | Admitting: Family Medicine

## 2014-06-22 VITALS — BP 122/69 | HR 103 | Temp 97.8°F | Ht 64.0 in | Wt 239.9 lb

## 2014-06-22 DIAGNOSIS — O26849 Uterine size-date discrepancy, unspecified trimester: Secondary | ICD-10-CM

## 2014-06-22 DIAGNOSIS — Z3480 Encounter for supervision of other normal pregnancy, unspecified trimester: Secondary | ICD-10-CM

## 2014-06-22 DIAGNOSIS — O26843 Uterine size-date discrepancy, third trimester: Secondary | ICD-10-CM

## 2014-06-22 DIAGNOSIS — Z3483 Encounter for supervision of other normal pregnancy, third trimester: Secondary | ICD-10-CM

## 2014-06-22 NOTE — Progress Notes (Signed)
Patient ID: Cassandra Chandler, female   DOB: 08/12/1981, 33 y.o.   MRN: [id number]  Cassandra Chandler is a 33 y.o. [id number] at [redacted]w[redacted]d for routine follow up.  She reports she is doing well. Denies cramping/ctx, fluid leaking, vaginal bleeding, or decreased fetal movement.   See flow sheet for details.  Fundal height: 37cm FHR: 150  A/P: Pregnancy at [redacted]w[redacted]d.  Doing well.   Pregnancy issues include size>dates. Will order f/u OB ultrasound to assess for polyhydramnios, LGA, etc. Also needs repeat to f/u on cardiac anatomy survey.  Infant feeding choice: plans to attempt breastfeeding Contraception choice: planning for BTL Infant circumcision desired: not applicable (having a girl)  Tdapwas not given today - already given GBS/GC/CZ testing was not performed today - await 36 week appt.  Preterm labor precautions reviewed. Safe sleep discussed. Kick counts reviewed. Follow up 2 weeks.

## 2014-06-22 NOTE — Patient Instructions (Signed)
We are getting you set up for an ultrasound since you measured a little bigger today than expected.  If you have any cramping/contractions, vaginal bleeding, fluid leaking, or are worried that baby is not moving normally, go immediately to [facility] to be evaluated.    Fetal Movement Counts Patient Name: __________________________________________________ Patient Due Date: ____________________ Performing a fetal movement count is highly recommended in high-risk pregnancies, but it is good for every pregnant woman to do. Your health care provider may ask you to start counting fetal movements at 28 weeks of the pregnancy. Fetal movements often increase:  After eating a full meal.  After physical activity.  After eating or drinking something sweet or cold.  At rest. Pay attention to when you feel the baby is most active. This will help you notice a pattern of your baby's sleep and wake cycles and what factors contribute to an increase in fetal movement. It is important to perform a fetal movement count at the same time each day when your baby is normally most active.  HOW TO COUNT FETAL MOVEMENTS 1. Find a quiet and comfortable area to sit or lie down on your left side. Lying on your left side provides the best blood and oxygen circulation to your baby. 2. Write down the day and time on a sheet of paper or in a journal. 3. Start counting kicks, flutters, swishes, rolls, or jabs in a 2-hour period. You should feel at least 10 movements within 2 hours. 4. If you do not feel 10 movements in 2 hours, wait 2-3 hours and count again. Look for a change in the pattern or not enough counts in 2 hours. SEEK MEDICAL CARE IF:  You feel less than 10 counts in 2 hours, tried twice.  There is no movement in over an hour.  The pattern is changing or taking longer each day to reach 10 counts in 2 hours.  You feel the baby is not moving as he or she usually does. Date: ____________ Movements:  ____________ Start time: ____________ Cassandra Chandler time: ____________  Date: ____________ Movements: ____________ Start time: ____________ Cassandra Chandler time: ____________ Date: ____________ Movements: ____________ Start time: ____________ Cassandra Chandler time: ____________ Date: ____________ Movements: ____________ Start time: ____________ Cassandra Chandler time: ____________ Date: ____________ Movements: ____________ Start time: ____________ Cassandra Chandler time: ____________ Date: ____________ Movements: ____________ Start time: ____________ Cassandra Chandler time: ____________ Date: ____________ Movements: ____________ Start time: ____________ Cassandra Chandler time: ____________ Date: ____________ Movements: ____________ Start time: ____________ Cassandra Chandler time: ____________  Date: ____________ Movements: ____________ Start time: ____________ Cassandra Chandler time: ____________ Date: ____________ Movements: ____________ Start time: ____________ Cassandra Chandler time: ____________ Date: ____________ Movements: ____________ Start time: ____________ Cassandra Chandler time: ____________ Date: ____________ Movements: ____________ Start time: ____________ Cassandra Chandler time: ____________ Date: ____________ Movements: ____________ Start time: ____________ Cassandra Chandler time: ____________ Date: ____________ Movements: ____________ Start time: ____________ Cassandra Chandler time: ____________ Date: ____________ Movements: ____________ Start time: ____________ Cassandra Chandler time: ____________  Date: ____________ Movements: ____________ Start time: ____________ Cassandra Chandler time: ____________ Date: ____________ Movements: ____________ Start time: ____________ Cassandra Chandler time: ____________ Date: ____________ Movements: ____________ Start time: ____________ Cassandra Chandler time: ____________ Date: ____________ Movements: ____________ Start time: ____________ Cassandra Chandler time: ____________ Date: ____________ Movements: ____________ Start time: ____________ Cassandra Chandler time: ____________ Date: ____________ Movements: ____________ Start time: ____________ Cassandra Chandler  time: ____________ Date: ____________ Movements: ____________ Start time: ____________ Cassandra Chandler time: ____________  Date: ____________ Movements: ____________ Start time: ____________ Cassandra Chandler time: ____________ Date: ____________ Movements: ____________ Start time: ____________ Cassandra Chandler time: ____________ Date: ____________ Movements: ____________ Start time: ____________ Cassandra Chandler time: ____________ Date:  ____________ Movements: ____________ Start time: ____________ Cassandra Chandler time: ____________ Date: ____________ Movements: ____________ Start time: ____________ Cassandra Chandler time: ____________ Date: ____________ Movements: ____________ Start time: ____________ Cassandra Chandler time: ____________ Date: ____________ Movements: ____________ Start time: ____________ Cassandra Chandler time: ____________  Date: ____________ Movements: ____________ Start time: ____________ Cassandra Chandler time: ____________ Date: ____________ Movements: ____________ Start time: ____________ Cassandra Chandler time: ____________ Date: ____________ Movements: ____________ Start time: ____________ Cassandra Chandler time: ____________ Date: ____________ Movements: ____________ Start time: ____________ Cassandra Chandler time: ____________ Date: ____________ Movements: ____________ Start time: ____________ Cassandra Chandler time: ____________ Date: ____________ Movements: ____________ Start time: ____________ Cassandra Chandler time: ____________ Date: ____________ Movements: ____________ Start time: ____________ Cassandra Chandler time: ____________  Date: ____________ Movements: ____________ Start time: ____________ Cassandra Chandler time: ____________ Date: ____________ Movements: ____________ Start time: ____________ Cassandra Chandler time: ____________ Date: ____________ Movements: ____________ Start time: ____________ Cassandra Chandler time: ____________ Date: ____________ Movements: ____________ Start time: ____________ Cassandra Chandler time: ____________ Date: ____________ Movements: ____________ Start time: ____________ Cassandra Chandler time: ____________ Date: ____________  Movements: ____________ Start time: ____________ Cassandra Chandler time: ____________ Date: ____________ Movements: ____________ Start time: ____________ Cassandra Chandler time: ____________  Date: ____________ Movements: ____________ Start time: ____________ Cassandra Chandler time: ____________ Date: ____________ Movements: ____________ Start time: ____________ Cassandra Chandler time: ____________ Date: ____________ Movements: ____________ Start time: ____________ Cassandra Chandler time: ____________ Date: ____________ Movements: ____________ Start time: ____________ Cassandra Chandler time: ____________ Date: ____________ Movements: ____________ Start time: ____________ Cassandra Chandler time: ____________ Date: ____________ Movements: ____________ Start time: ____________ Cassandra Chandler time: ____________ Date: ____________ Movements: ____________ Start time: ____________ Cassandra Chandler time: ____________  Date: ____________ Movements: ____________ Start time: ____________ Cassandra Chandler time: ____________ Date: ____________ Movements: ____________ Start time: ____________ Cassandra Chandler time: ____________ Date: ____________ Movements: ____________ Start time: ____________ Cassandra Chandler time: ____________ Date: ____________ Movements: ____________ Start time: ____________ Cassandra Chandler time: ____________ Date: ____________ Movements: ____________ Start time: ____________ Cassandra Chandler time: ____________ Date: ____________ Movements: ____________ Start time: ____________ Cassandra Chandler time: ____________ Document Released: 11/15/2006 Document Revised: 03/02/2014 Document Reviewed: 08/12/2012 Exit[employer] Patient Information 2015 [employer]. This information is not intended to replace advice given to you by your health care provider. Make sure you discuss any questions you have with your health care provider.

## 2014-06-23 ENCOUNTER — Ambulatory Visit (HOSPITAL_COMMUNITY)
Admission: RE | Admit: 2014-06-23 | Discharge: 2014-06-23 | Disposition: A | Discharge status: Home / Self Care | Payer: 59 | Source: Ambulatory Visit | Attending: Family Medicine | Admitting: Family Medicine

## 2014-06-23 DIAGNOSIS — E669 Obesity, unspecified: Secondary | ICD-10-CM | POA: Diagnosis not present

## 2014-06-23 DIAGNOSIS — O358XX0 Maternal care for other (suspected) fetal abnormality and damage, not applicable or unspecified: Secondary | ICD-10-CM | POA: Diagnosis present

## 2014-06-23 DIAGNOSIS — Z3689 Encounter for other specified antenatal screening: Secondary | ICD-10-CM | POA: Insufficient documentation

## 2014-06-23 DIAGNOSIS — Z3483 Encounter for supervision of other normal pregnancy, third trimester: Secondary | ICD-10-CM

## 2014-06-23 DIAGNOSIS — O99210 Obesity complicating pregnancy, unspecified trimester: Secondary | ICD-10-CM

## 2014-06-25 ENCOUNTER — Encounter: Payer: Self-pay | Admitting: Family Medicine

## 2014-06-29 ENCOUNTER — Telehealth: Payer: Self-pay | Admitting: Family Medicine

## 2014-06-29 NOTE — Telephone Encounter (Signed)
Message copied by Pamelia Hoit on Mon Jun 29, 2014 11:33 AM ------      Message from: Latrelle Dodrill      Created: Thu Jun 25, 2014  4:22 PM       [facility] red team, please call pt and inform that repeat u/s was normal and baby is growing normally. ------

## 2014-06-29 NOTE — Telephone Encounter (Signed)
Dear [place] [facility] Team I have not called her [facility]! Denny Levy

## 2014-06-29 NOTE — Telephone Encounter (Signed)
Pt informed of normal u/s. Oden Lindaman CMA

## 2014-06-29 NOTE — Telephone Encounter (Signed)
Pt called and was returning a call from [place]. I didn't see any notes. jw

## 2014-07-07 ENCOUNTER — Ambulatory Visit (INDEPENDENT_AMBULATORY_CARE_PROVIDER_SITE_OTHER): Payer: 59 | Admitting: Family Medicine

## 2014-07-07 ENCOUNTER — Other Ambulatory Visit (HOSPITAL_COMMUNITY)
Admission: RE | Admit: 2014-07-07 | Discharge: 2014-07-07 | Disposition: A | Discharge status: Home / Self Care | Payer: 59 | Source: Ambulatory Visit | Attending: Family Medicine | Admitting: Family Medicine

## 2014-07-07 VITALS — BP 126/81 | HR 94 | Temp 98.4°F | Wt 241.4 lb

## 2014-07-07 DIAGNOSIS — Z113 Encounter for screening for infections with a predominantly sexual mode of transmission: Secondary | ICD-10-CM | POA: Diagnosis present

## 2014-07-07 DIAGNOSIS — Z3483 Encounter for supervision of other normal pregnancy, third trimester: Secondary | ICD-10-CM

## 2014-07-07 DIAGNOSIS — Z3480 Encounter for supervision of other normal pregnancy, unspecified trimester: Secondary | ICD-10-CM

## 2014-07-07 NOTE — Progress Notes (Signed)
Cassandra Chandler is a 34 y.o. [id number] at [redacted]w[redacted]d for routine follow up.  Denies cramping/ctx, fluid leaking, vaginal bleeding, or decreased fetal movement. Has mild lower ext edema but denies headache or abdominal pain. See flow sheet for details.  A/P: Pregnancy at [redacted]w[redacted]d.  Doing well.   Pregnancy issues include hx c/s x2, planning for elective repeat. Also still size > dates. Size-date discrepancy greater now than it was two weeks ago, but u/s showed appropriate linear growth after that appointment. Will have pt f/u in 1 week for next routine prenatal visit and examine size-date relationship at that time. Precepted with Dr. Lum Babe who agrees with this plan.  Contraception choice tubal ligation  GBS/GC/CZ testing was performed today.  Preterm labor precautions reviewed. Follow up 1 week.

## 2014-07-07 NOTE — Patient Instructions (Signed)
If you have any cramping/contractions, vaginal bleeding, fluid leaking, or are worried that baby is not moving normally, go immediately to [facility] to be evaluated.   Third Trimester of Pregnancy The third trimester is from week 29 through week 42, months 7 through 9. The third trimester is a time when the fetus is growing rapidly. At the end of the ninth month, the fetus is about 20 inches in length and weighs 6-10 pounds.  BODY CHANGES Your body goes through many changes during pregnancy. The changes vary from woman to woman.   Your weight will continue to increase. You can expect to gain 25-35 pounds (11-16 kg) by the end of the pregnancy.  You may begin to get stretch marks on your hips, abdomen, and breasts.  You may urinate more often because the fetus is moving lower into your pelvis and pressing on your bladder.  You may develop or continue to have heartburn as a result of your pregnancy.  You may develop constipation because certain hormones are causing the muscles that push waste through your intestines to slow down.  You may develop hemorrhoids or swollen, bulging veins (varicose veins).  You may have pelvic pain because of the weight gain and pregnancy hormones relaxing your joints between the bones in your pelvis. Backaches may result from overexertion of the muscles supporting your posture.  You may have changes in your hair. These can include thickening of your hair, rapid growth, and changes in texture. Some women also have hair loss during or after pregnancy, or hair that feels dry or thin. Your hair will most likely return to normal after your baby is born.  Your breasts will continue to grow and be tender. A yellow discharge may leak from your breasts called colostrum.  Your belly button may stick out.  You may feel short of breath because of your expanding uterus.  You may notice the fetus "dropping," or moving lower in your abdomen.  You may have a bloody  mucus discharge. This usually occurs a few days to a week before labor begins.  Your cervix becomes thin and soft (effaced) near your due date. WHAT TO EXPECT AT YOUR PRENATAL EXAMS  You will have prenatal exams every 2 weeks until week 36. Then, you will have weekly prenatal exams. During a routine prenatal visit:  You will be weighed to make sure you and the fetus are growing normally.  Your blood pressure is taken.  Your abdomen will be measured to track your baby's growth.  The fetal heartbeat will be listened to.  Any test results from the previous visit will be discussed.  You may have a cervical check near your due date to see if you have effaced. At around 36 weeks, your caregiver will check your cervix. At the same time, your caregiver will also perform a test on the secretions of the vaginal tissue. This test is to determine if a type of bacteria, Group B streptococcus, is present. Your caregiver will explain this further. Your caregiver may ask you:  What your birth plan is.  How you are feeling.  If you are feeling the baby move.  If you have had any abnormal symptoms, such as leaking fluid, bleeding, severe headaches, or abdominal cramping.  If you have any questions. Other tests or screenings that may be performed during your third trimester include:  Blood tests that check for low iron levels (anemia).  Fetal testing to check the health, activity level, and growth of  the fetus. Testing is done if you have certain medical conditions or if there are problems during the pregnancy. FALSE LABOR You may feel small, irregular contractions that eventually go away. These are called Braxton Hicks contractions, or false labor. Contractions may last for hours, days, or even weeks before true labor sets in. If contractions come at regular intervals, intensify, or become painful, it is best to be seen by your caregiver.  SIGNS OF LABOR   Menstrual-like cramps.  Contractions  that are 5 minutes apart or less.  Contractions that start on the top of the uterus and spread down to the lower abdomen and back.  A sense of increased pelvic pressure or back pain.  A watery or bloody mucus discharge that comes from the vagina. If you have any of these signs before the 37th week of pregnancy, call your caregiver right away. You need to go to the hospital to get checked immediately. HOME CARE INSTRUCTIONS   Avoid all smoking, herbs, alcohol, and unprescribed drugs. These chemicals affect the formation and growth of the baby.  Follow your caregiver's instructions regarding medicine use. There are medicines that are either safe or unsafe to take during pregnancy.  Exercise only as directed by your caregiver. Experiencing uterine cramps is a good sign to stop exercising.  Continue to eat regular, healthy meals.  Wear a good support bra for breast tenderness.  Do not use hot tubs, steam rooms, or saunas.  Wear your seat belt at all times when driving.  Avoid raw meat, uncooked cheese, cat litter boxes, and soil used by cats. These carry germs that can cause birth defects in the baby.  Take your prenatal vitamins.  Try taking a stool softener (if your caregiver approves) if you develop constipation. Eat more high-fiber foods, such as fresh vegetables or fruit and whole grains. Drink plenty of fluids to keep your urine clear or pale yellow.  Take warm sitz baths to soothe any pain or discomfort caused by hemorrhoids. Use hemorrhoid cream if your caregiver approves.  If you develop varicose veins, wear support hose. Elevate your feet for 15 minutes, 3-4 times a day. Limit salt in your diet.  Avoid heavy lifting, wear low heal shoes, and practice good posture.  Rest a lot with your legs elevated if you have leg cramps or low back pain.  Visit your dentist if you have not gone during your pregnancy. Use a soft toothbrush to brush your teeth and be gentle when you  floss.  A sexual relationship may be continued unless your caregiver directs you otherwise.  Do not travel far distances unless it is absolutely necessary and only with the approval of your caregiver.  Take prenatal classes to understand, practice, and ask questions about the labor and delivery.  Make a trial run to the hospital.  Pack your hospital bag.  Prepare the baby's nursery.  Continue to go to all your prenatal visits as directed by your caregiver. SEEK MEDICAL CARE IF:  You are unsure if you are in labor or if your water has broken.  You have dizziness.  You have mild pelvic cramps, pelvic pressure, or nagging pain in your abdominal area.  You have persistent nausea, vomiting, or diarrhea.  You have a bad smelling vaginal discharge.  You have pain with urination. SEEK IMMEDIATE MEDICAL CARE IF:   You have a fever.  You are leaking fluid from your vagina.  You have spotting or bleeding from your vagina.  You have severe  abdominal cramping or pain.  You have rapid weight loss or gain.  You have shortness of breath with chest pain.  You notice sudden or extreme swelling of your face, hands, ankles, feet, or legs.  You have not felt your baby move in over an hour.  You have severe headaches that do not go away with medicine.  You have vision changes. Document Released: 10/10/2001 Document Revised: 10/21/2013 Document Reviewed: 12/17/2012 [facility] Patient Information 2015 [place], [place]. This information is not intended to replace advice given to you by your health care provider. Make sure you discuss any questions you have with your health care provider.

## 2014-07-08 ENCOUNTER — Encounter: Payer: Self-pay | Admitting: Family Medicine

## 2014-07-08 LAB — STREP B DNA PROBE: GBSP: DETECTED

## 2014-07-16 ENCOUNTER — Ambulatory Visit (INDEPENDENT_AMBULATORY_CARE_PROVIDER_SITE_OTHER): Payer: 59 | Admitting: Family Medicine

## 2014-07-16 VITALS — BP 116/80 | HR 101 | Temp 98.2°F | Wt 244.0 lb

## 2014-07-16 DIAGNOSIS — Z3480 Encounter for supervision of other normal pregnancy, unspecified trimester: Secondary | ICD-10-CM

## 2014-07-16 DIAGNOSIS — Z3483 Encounter for supervision of other normal pregnancy, third trimester: Secondary | ICD-10-CM

## 2014-07-16 NOTE — Progress Notes (Signed)
S: Pt is a 33 y.o. [id number] at [redacted]w[redacted]d by LMP consistent with early [place] who presents to clinic for routine OB follow-up. She has no specific complaints other than ankle edema and reports she is taking prenatal vitamins. She has no bleeding, LOF, or discharge. She has had no contractions. She has had two C-sections in the past and is scheduled for repeat with BTL on 10/1. She feels well, otherwise, without fever, chills, N/V, headache, or LE swelling. She has had no changes in her vision or RUQ pain.  O: See vitals and notes for fetal data.  BP 116/80  Pulse 101  Temp(Src) 98.2 F (36.8 C)  Wt 244 lb (110.678 kg)  LMP 10/30/2013 Gen: well-appearing, in NAD  Abd: soft, nontender; obese; gravid in contour; BS+  Ext: warm, well-perfused, no LE edema   A/P:  33 y.o. [id number] at [redacted]w[redacted]d, hx of C/S x2 and borderline HTN not on medications; doing well without complaints  - declines flu shot, today - reviewed signs / symptoms that would necessitate a clinic call or MAU visit  - recent repeat US showed EFW at ~70th %ile for age; fundal height 39 on exam today, but has had size > dates previously - continue PNV, reviewed third trimester expectations and preterm precautions  - f/u weekly until planned repeat C/S with BTL on 10/1; I will plan to assist this for continuity  Bobbye Morton, MD PGY-3, [facility] 07/16/2014, 4:25 PM

## 2014-07-16 NOTE — Patient Instructions (Signed)
Thank you for coming in, today!  Make sure you keep your regular appointments. Call [facility] to see if / what they need you to do before your C-section.  Please ask the people getting you ready on that day to let me know when you are ready, so I can be there. They can see my pager number on their "sticky note" -- they will know what you're taking about.  Come back to see me next week. Please feel free to call with any questions or concerns at any time, at [phone]. --Dr. Casper Harrison  Third Trimester of Pregnancy The third trimester is from week 29 through week 42, months 7 through 9. This trimester is when your unborn baby (fetus) is growing very fast. At the end of the ninth month, the unborn baby is about 20 inches in length. It weighs about 6-10 pounds.  HOME CARE   Avoid all smoking, herbs, and alcohol. Avoid drugs not approved by your doctor.  Only take medicine as told by your doctor. Some medicines are safe and some are not during pregnancy.  Exercise only as told by your doctor. Stop exercising if you start having cramps.  Eat regular, healthy meals.  Wear a good support bra if your breasts are tender.  Do not use hot tubs, steam rooms, or saunas.  Wear your seat belt when driving.  Avoid raw meat, uncooked cheese, and liter boxes and soil used by cats.  Take your prenatal vitamins.  Try taking medicine that helps you poop (stool softener) as needed, and if your doctor approves. Eat more fiber by eating fresh fruit, vegetables, and whole grains. Drink enough fluids to keep your pee (urine) clear or pale yellow.  Take warm water baths (sitz baths) to soothe pain or discomfort caused by hemorrhoids. Use hemorrhoid cream if your doctor approves.  If you have puffy, bulging veins (varicose veins), wear support hose. Raise (elevate) your feet for 15 minutes, 3-4 times a day. Limit salt in your diet.  Avoid heavy lifting, wear low heels, and sit up straight.  Rest with  your legs raised if you have leg cramps or low back pain.  Visit your dentist if you have not gone during your pregnancy. Use a soft toothbrush to brush your teeth. Be gentle when you floss.  You can have sex (intercourse) unless your doctor tells you not to.  Do not travel far distances unless you must. Only do so with your doctor's approval.  Take prenatal classes.  Practice driving to the hospital.  Pack your hospital bag.  Prepare the baby's room.  Go to your doctor visits. GET HELP IF:  You are not sure if you are in labor or if your water has broken.  You are dizzy.  You have mild cramps or pressure in your lower belly (abdominal).  You have a nagging pain in your belly area.  You continue to feel sick to your stomach (nauseous), throw up (vomit), or have watery poop (diarrhea).  You have bad smelling fluid coming from your vagina.  You have pain with peeing (urination). GET HELP RIGHT AWAY IF:   You have a fever.  You are leaking fluid from your vagina.  You are spotting or bleeding from your vagina.  You have severe belly cramping or pain.  You lose or gain weight rapidly.  You have trouble catching your breath and have chest pain.  You notice sudden or extreme puffiness (swelling) of your face, hands, ankles, feet, or legs.  You have not felt the baby move in over an hour.  You have severe headaches that do not go away with medicine.  You have vision changes. Document Released: 01/10/2010 Document Revised: 02/10/2013 Document Reviewed: 12/17/2012 [facility] Patient Information 2015 [place], [place]. This information is not intended to replace advice given to you by your health care provider. Make sure you discuss any questions you have with your health care provider.

## 2014-07-16 NOTE — Assessment & Plan Note (Signed)
See vitals and notes for specific details. 32yo [id number] at [redacted]w[redacted]d by LMP doing well. Some LE swelling but no other s/s of pre-e. Planning repeat C/S with BTL on 10/1. F/u weekly until then.

## 2014-07-21 ENCOUNTER — Ambulatory Visit (INDEPENDENT_AMBULATORY_CARE_PROVIDER_SITE_OTHER): Payer: 59 | Admitting: Family Medicine

## 2014-07-21 VITALS — BP 130/82 | HR 96 | Temp 98.3°F | Wt 243.9 lb

## 2014-07-21 DIAGNOSIS — O26849 Uterine size-date discrepancy, unspecified trimester: Secondary | ICD-10-CM | POA: Insufficient documentation

## 2014-07-21 DIAGNOSIS — Z3480 Encounter for supervision of other normal pregnancy, unspecified trimester: Secondary | ICD-10-CM

## 2014-07-21 DIAGNOSIS — O26843 Uterine size-date discrepancy, third trimester: Secondary | ICD-10-CM

## 2014-07-21 DIAGNOSIS — Z3483 Encounter for supervision of other normal pregnancy, third trimester: Secondary | ICD-10-CM

## 2014-07-21 NOTE — Progress Notes (Signed)
S: Pt is a 33 y.o. [id number] at [redacted]w[redacted]d by LMP consistent with early [place] who presents to clinic for routine OB follow-up. She has no specific complaints. She continues to have ankle edema but denies headache, change in vision, abdominal pain. She reports she is taking prenatal vitamins. She has no bleeding, LOF, or discharge. She has had no contractions. She has had two C-sections in the past and is scheduled for repeat with BTL on 10/1. She feels well, otherwise, without fever, chills, N/V.  O: See vitals and notes for fetal data.  BP 130/82  Pulse 96  Temp(Src) 98.3 F (36.8 C)  Wt 243 lb 14.4 oz (110.632 kg)  LMP 10/30/2013 Initial BP by machine check 138/87 Gen: well-appearing, in NAD  Abd: soft, nontender; obese; gravid in contour; BS+  Ext: warm, well-perfused, no LE edema   A/P:  33 y.o. [id number] at [redacted]w[redacted]d, hx of C/S x2 and borderline HTN not on medications; doing well without complaints  - declines flu shot, today; will reconsider and may get one next week - reviewed signs / symptoms that would necessitate a clinic call or MAU visit  - recent repeat US showed EFW at ~70th %ile for age; fundal height 40 (has had size > dates previously) - continue PNV, reviewed third trimester expectations and preterm precautions  - f/u weekly until planned repeat C/S with BTL on 10/1; I will plan to assist this for continuity  Bobbye Morton, MD PGY-3, [facility] 07/21/2014, 4:46 PM

## 2014-07-21 NOTE — Patient Instructions (Signed)
Thank you for coming in, today!  Keep taking your prenatal vitamins. Make sure you keep your regular appointments, weekly until delivery. Call [facility] to make sure you know what to do the morning of your c-section.  Come back to see me next week, weekly until you deliver. Please feel free to call with any questions or concerns at any time, at [phone]. --Dr. Casper Harrison  Third Trimester of Pregnancy The third trimester is from week 29 through week 42, months 7 through 9. This trimester is when your unborn baby (fetus) is growing very fast. At the end of the ninth month, the unborn baby is about 20 inches in length. It weighs about 6-10 pounds.  HOME CARE   Avoid all smoking, herbs, and alcohol. Avoid drugs not approved by your doctor.  Only take medicine as told by your doctor. Some medicines are safe and some are not during pregnancy.  Exercise only as told by your doctor. Stop exercising if you start having cramps.  Eat regular, healthy meals.  Wear a good support bra if your breasts are tender.  Do not use hot tubs, steam rooms, or saunas.  Wear your seat belt when driving.  Avoid raw meat, uncooked cheese, and liter boxes and soil used by cats.  Take your prenatal vitamins.  Try taking medicine that helps you poop (stool softener) as needed, and if your doctor approves. Eat more fiber by eating fresh fruit, vegetables, and whole grains. Drink enough fluids to keep your pee (urine) clear or pale yellow.  Take warm water baths (sitz baths) to soothe pain or discomfort caused by hemorrhoids. Use hemorrhoid cream if your doctor approves.  If you have puffy, bulging veins (varicose veins), wear support hose. Raise (elevate) your feet for 15 minutes, 3-4 times a day. Limit salt in your diet.  Avoid heavy lifting, wear low heels, and sit up straight.  Rest with your legs raised if you have leg cramps or low back pain.  Visit your dentist if you have not gone during your  pregnancy. Use a soft toothbrush to brush your teeth. Be gentle when you floss.  You can have sex (intercourse) unless your doctor tells you not to.  Do not travel far distances unless you must. Only do so with your doctor's approval.  Take prenatal classes.  Practice driving to the hospital.  Pack your hospital bag.  Prepare the baby's room.  Go to your doctor visits. GET HELP IF:  You are not sure if you are in labor or if your water has broken.  You are dizzy.  You have mild cramps or pressure in your lower belly (abdominal).  You have a nagging pain in your belly area.  You continue to feel sick to your stomach (nauseous), throw up (vomit), or have watery poop (diarrhea).  You have bad smelling fluid coming from your vagina.  You have pain with peeing (urination). GET HELP RIGHT AWAY IF:   You have a fever.  You are leaking fluid from your vagina.  You are spotting or bleeding from your vagina.  You have severe belly cramping or pain.  You lose or gain weight rapidly.  You have trouble catching your breath and have chest pain.  You notice sudden or extreme puffiness (swelling) of your face, hands, ankles, feet, or legs.  You have not felt the baby move in over an hour.  You have severe headaches that do not go away with medicine.  You have vision changes. Document  Released: 01/10/2010 Document Revised: 02/10/2013 Document Reviewed: 12/17/2012 [facility] Patient Information 2015 [place], [place]. This information is not intended to replace advice given to you by your health care provider. Make sure you discuss any questions you have with your health care provider.

## 2014-07-21 NOTE — Assessment & Plan Note (Signed)
See vitals and notes section for specific details. 33 y.o. [id number] at [redacted]w[redacted]d by LMP doing well. Continues to have LE swelling and BP is slightly elevated today but no other worrisome signs/symptoms. Following weekly until delivery (repeat C/S on 10/1). Reviewed signs/symptoms that would prompt return to clinic or immediate presentation to MAU.

## 2014-07-23 ENCOUNTER — Encounter (HOSPITAL_COMMUNITY): Payer: Self-pay | Admitting: Pharmacist

## 2014-07-27 ENCOUNTER — Ambulatory Visit (INDEPENDENT_AMBULATORY_CARE_PROVIDER_SITE_OTHER): Payer: 59 | Admitting: Family Medicine

## 2014-07-27 VITALS — BP 132/78 | HR 95 | Temp 98.3°F | Wt 244.9 lb

## 2014-07-27 DIAGNOSIS — O26843 Uterine size-date discrepancy, third trimester: Secondary | ICD-10-CM

## 2014-07-27 DIAGNOSIS — O26849 Uterine size-date discrepancy, unspecified trimester: Secondary | ICD-10-CM

## 2014-07-27 DIAGNOSIS — Z3483 Encounter for supervision of other normal pregnancy, third trimester: Secondary | ICD-10-CM

## 2014-07-27 DIAGNOSIS — Z3480 Encounter for supervision of other normal pregnancy, unspecified trimester: Secondary | ICD-10-CM

## 2014-07-27 DIAGNOSIS — Z23 Encounter for immunization: Secondary | ICD-10-CM

## 2014-07-27 NOTE — Patient Instructions (Signed)
Thank you for coming in, today!  Everything looks okay today. Your blood pressure is slightly high again, but otherwise everything seems okay.  Make sure you get there plenty early at [facility] on Thursday. If you have any right upper abdominal pain, headaches, changes in your vision, or worse swelling before then, go straight to [facility]  Otherwise, I will see you on Thursday.  Please feel free to call with any questions or concerns at any time, at [phone]. --Dr. Casper Harrison  Third Trimester of Pregnancy The third trimester is from week 29 through week 42, months 7 through 9. This trimester is when your unborn baby (fetus) is growing very fast. At the end of the ninth month, the unborn baby is about 20 inches in length. It weighs about 6-10 pounds.  HOME CARE   Avoid all smoking, herbs, and alcohol. Avoid drugs not approved by your doctor.  Only take medicine as told by your doctor. Some medicines are safe and some are not during pregnancy.  Exercise only as told by your doctor. Stop exercising if you start having cramps.  Eat regular, healthy meals.  Wear a good support bra if your breasts are tender.  Do not use hot tubs, steam rooms, or saunas.  Wear your seat belt when driving.  Avoid raw meat, uncooked cheese, and liter boxes and soil used by cats.  Take your prenatal vitamins.  Try taking medicine that helps you poop (stool softener) as needed, and if your doctor approves. Eat more fiber by eating fresh fruit, vegetables, and whole grains. Drink enough fluids to keep your pee (urine) clear or pale yellow.  Take warm water baths (sitz baths) to soothe pain or discomfort caused by hemorrhoids. Use hemorrhoid cream if your doctor approves.  If you have puffy, bulging veins (varicose veins), wear support hose. Raise (elevate) your feet for 15 minutes, 3-4 times a day. Limit salt in your diet.  Avoid heavy lifting, wear low heels, and sit up straight.  Rest with your legs  raised if you have leg cramps or low back pain.  Visit your dentist if you have not gone during your pregnancy. Use a soft toothbrush to brush your teeth. Be gentle when you floss.  You can have sex (intercourse) unless your doctor tells you not to.  Do not travel far distances unless you must. Only do so with your doctor's approval.  Take prenatal classes.  Practice driving to the hospital.  Pack your hospital bag.  Prepare the baby's room.  Go to your doctor visits. GET HELP IF:  You are not sure if you are in labor or if your water has broken.  You are dizzy.  You have mild cramps or pressure in your lower belly (abdominal).  You have a nagging pain in your belly area.  You continue to feel sick to your stomach (nauseous), throw up (vomit), or have watery poop (diarrhea).  You have bad smelling fluid coming from your vagina.  You have pain with peeing (urination). GET HELP RIGHT AWAY IF:   You have a fever.  You are leaking fluid from your vagina.  You are spotting or bleeding from your vagina.  You have severe belly cramping or pain.  You lose or gain weight rapidly.  You have trouble catching your breath and have chest pain.  You notice sudden or extreme puffiness (swelling) of your face, hands, ankles, feet, or legs.  You have not felt the baby move in over an hour.  You  have severe headaches that do not go away with medicine.  You have vision changes. Document Released: 01/10/2010 Document Revised: 02/10/2013 Document Reviewed: 12/17/2012 [facility] Patient Information 2015 [place], [place]. This information is not intended to replace advice given to you by your health care provider. Make sure you discuss any questions you have with your health care provider.

## 2014-07-27 NOTE — Assessment & Plan Note (Signed)
See vitals and notes section for specific details. 33yo [id number] at [redacted]w[redacted]d by LMP, doing well. Scheduled for repeat C/S later this week. Still with some LE swelling and slight BP elevation, but otherwise no worrisome signs / symptoms. Reviewed signs / symptoms that would prompt immediate presentation to MAU.

## 2014-07-27 NOTE — Progress Notes (Signed)
S: Pt is a 33 y.o. [id number] at [redacted]w[redacted]d by LMP consistent with early [place] who presents to clinic for routine OB follow-up. She continues to have no specific complaints. She still has ankle edema but denies headache, change in vision, abdominal pain. She reports she is taking prenatal vitamins. She has no bleeding, LOF, or discharge. She has had no contractions. She is scheduled for repeat with BTL on 10/1 (later this week). She feels well, otherwise, without fever, chills, N/V.  O: See vitals and notes for fetal data.  BP 132/78  Pulse 95  Temp(Src) 98.3 F (36.8 C)  Wt 244 lb 14.4 oz (111.086 kg)  LMP 10/30/2013 Initial BP by machine check 136/87 Gen: well-appearing, in NAD  Abd: soft, nontender; obese; gravid in contour; BS+  Ext: warm, well-perfused, no LE edema   A/P:  33 y.o. [id number] at [redacted]w[redacted]d, hx of C/S x2 and borderline HTN not on medications; doing well without complaints  - flu shot given today - reviewed signs / symptoms that would necessitate a clinic call or MAU visit  - recent repeat US showed EFW at ~70th %ile for age; fundal height 41 (has had size > dates previously) - continue PNV, reviewed third trimester expectations and preterm precautions  - planned repeat C/S with BTL on 10/1, this Thursday; I will plan to assist this for continuity  Bobbye Morton, MD PGY-3, [facility] 07/27/2014, 3:41 PM

## 2014-07-29 ENCOUNTER — Encounter (HOSPITAL_COMMUNITY): Payer: Self-pay | Admitting: *Deleted

## 2014-07-30 ENCOUNTER — Encounter (HOSPITAL_COMMUNITY): Payer: Self-pay

## 2014-07-30 ENCOUNTER — Inpatient Hospital Stay (HOSPITAL_COMMUNITY)
Admission: RE | Admit: 2014-07-30 | Discharge: 2014-08-01 | DRG: 765 | Disposition: A | Discharge status: Home / Self Care | Payer: 59 | Source: Ambulatory Visit | Attending: Obstetrics & Gynecology | Admitting: Obstetrics & Gynecology

## 2014-07-30 ENCOUNTER — Inpatient Hospital Stay (HOSPITAL_COMMUNITY): Payer: 59 | Admitting: Anesthesiology

## 2014-07-30 ENCOUNTER — Encounter (HOSPITAL_COMMUNITY): Payer: 59 | Admitting: Anesthesiology

## 2014-07-30 ENCOUNTER — Encounter (HOSPITAL_COMMUNITY)
Admission: RE | Disposition: A | Discharge status: Home / Self Care | Payer: Self-pay | Source: Ambulatory Visit | Attending: Obstetrics & Gynecology

## 2014-07-30 DIAGNOSIS — Z3A39 39 weeks gestation of pregnancy: Secondary | ICD-10-CM | POA: Diagnosis present

## 2014-07-30 DIAGNOSIS — O3421 Maternal care for scar from previous cesarean delivery: Secondary | ICD-10-CM | POA: Diagnosis present

## 2014-07-30 DIAGNOSIS — Z8249 Family history of ischemic heart disease and other diseases of the circulatory system: Secondary | ICD-10-CM | POA: Diagnosis not present

## 2014-07-30 DIAGNOSIS — O1002 Pre-existing essential hypertension complicating childbirth: Secondary | ICD-10-CM

## 2014-07-30 DIAGNOSIS — O99214 Obesity complicating childbirth: Secondary | ICD-10-CM | POA: Diagnosis present

## 2014-07-30 DIAGNOSIS — Z87891 Personal history of nicotine dependence: Secondary | ICD-10-CM

## 2014-07-30 DIAGNOSIS — Z6841 Body Mass Index (BMI) 40.0 and over, adult: Secondary | ICD-10-CM

## 2014-07-30 DIAGNOSIS — O9902 Anemia complicating childbirth: Secondary | ICD-10-CM | POA: Diagnosis present

## 2014-07-30 DIAGNOSIS — D6489 Other specified anemias: Secondary | ICD-10-CM | POA: Diagnosis present

## 2014-07-30 DIAGNOSIS — Z302 Encounter for sterilization: Secondary | ICD-10-CM

## 2014-07-30 DIAGNOSIS — O34219 Maternal care for unspecified type scar from previous cesarean delivery: Secondary | ICD-10-CM

## 2014-07-30 LAB — BASIC METABOLIC PANEL
ANION GAP: 13 (ref 5–15)
BUN: 8 mg/dL (ref 6–23)
CALCIUM: 9.4 mg/dL (ref 8.4–10.5)
CO2: 22 mEq/L (ref 19–32)
CREATININE: 0.52 mg/dL (ref 0.50–1.10)
Chloride: 102 mEq/L (ref 96–112)
GFR calc Af Amer: >90 mL/min (ref >90)
GFR calc non Af Amer: >90 mL/min (ref >90)
Glucose, Bld: 82 mg/dL (ref 70–99)
Potassium: 4.3 mEq/L (ref 3.7–5.3)
Sodium: 137 mEq/L (ref 137–147)

## 2014-07-30 LAB — HEPATIC FUNCTION PANEL
ALT: 11 U/L (ref 0–35)
AST: 11 U/L (ref 0–37)
Albumin: 3 g/dL — ABNORMAL LOW (ref 3.5–5.2)
Alkaline Phosphatase: 138 U/L — ABNORMAL HIGH (ref 39–117)
TOTAL PROTEIN: 7.1 g/dL (ref 6.0–8.3)
Total Bilirubin: <0.2 mg/dL — ABNORMAL LOW (ref 0.3–1.2)

## 2014-07-30 LAB — CBC
HEMATOCRIT: 32.9 % — AB (ref 36.0–46.0)
Hemoglobin: 10.9 g/dL — ABNORMAL LOW (ref 12.0–15.0)
MCH: 27.9 pg (ref 26.0–34.0)
MCHC: 33.1 g/dL (ref 30.0–36.0)
MCV: 84.4 fL (ref 78.0–100.0)
Platelets: 234 10*3/uL (ref 150–400)
RBC: 3.9 MIL/uL (ref 3.87–5.11)
RDW: 14.5 % (ref 11.5–15.5)
WBC: 5.5 10*3/uL (ref 4.0–10.5)

## 2014-07-30 LAB — TYPE AND SCREEN
ABO/RH(D): A POS
Antibody Screen: NEGATIVE

## 2014-07-30 LAB — PROTEIN / CREATININE RATIO, URINE
Creatinine, Urine: 34.69 mg/dL
PROTEIN CREATININE RATIO: 0.12 (ref 0.00–0.15)
TOTAL PROTEIN, URINE: 4.1 mg/dL

## 2014-07-30 LAB — RPR

## 2014-07-30 LAB — ABO/RH: ABO/RH(D): A POS

## 2014-07-30 SURGERY — Surgical Case
Anesthesia: Spinal | Site: Abdomen | Laterality: Bilateral

## 2014-07-30 MED ORDER — DEXAMETHASONE SODIUM PHOSPHATE 4 MG/ML IJ SOLN
INTRAMUSCULAR | Status: DC | PRN
  Administered 2014-07-30: 4 mg via INTRAVENOUS

## 2014-07-30 MED ORDER — NALOXONE HCL 0.4 MG/ML IJ SOLN
0.4000 mg | INTRAMUSCULAR | Status: DC | PRN
Start: 2014-07-30 — End: 2014-08-01

## 2014-07-30 MED ORDER — BUPIVACAINE HCL (PF) 0.5 % IJ SOLN
INTRAMUSCULAR | Status: AC
  Filled 2014-07-30: qty 30

## 2014-07-30 MED ORDER — ONDANSETRON HCL 4 MG/2ML IJ SOLN
INTRAMUSCULAR | Status: AC
  Filled 2014-07-30: qty 2

## 2014-07-30 MED ORDER — MEASLES, MUMPS & RUBELLA VAC ~~LOC~~ INJ: 0.5000 mL | INJECTION | Freq: Once | SUBCUTANEOUS | Status: DC

## 2014-07-30 MED ORDER — CEFAZOLIN SODIUM-DEXTROSE 2-3 GM-% IV SOLR
INTRAVENOUS | Status: AC
  Administered 2014-07-30: 2 g via INTRAVENOUS
  Filled 2014-07-30: qty 50

## 2014-07-30 MED ORDER — NALBUPHINE HCL 10 MG/ML IJ SOLN: 5.0000 mg | INTRAMUSCULAR | Status: DC | PRN

## 2014-07-30 MED ORDER — LACTATED RINGERS IV SOLN
INTRAVENOUS | Status: DC
  Administered 2014-07-30: 13:00:00 via INTRAVENOUS

## 2014-07-30 MED ORDER — SODIUM CHLORIDE 0.9 % IR SOLN
Status: DC | PRN
  Administered 2014-07-30: 1000 mL

## 2014-07-30 MED ORDER — ONDANSETRON HCL 4 MG/2ML IJ SOLN: INTRAMUSCULAR | Status: DC | PRN

## 2014-07-30 MED ORDER — OXYCODONE-ACETAMINOPHEN 5-325 MG PO TABS: 2.0000 | ORAL_TABLET | ORAL | Status: DC | PRN

## 2014-07-30 MED ORDER — DIBUCAINE 1 % RE OINT: 1.0000 "application " | TOPICAL_OINTMENT | RECTAL | Status: DC | PRN

## 2014-07-30 MED ORDER — SIMETHICONE 80 MG PO CHEW
80.0000 mg | CHEWABLE_TABLET | ORAL | Status: DC
  Administered 2014-07-30 – 2014-08-01 (×2): 80 mg via ORAL
  Filled 2014-07-30 (×2): qty 1

## 2014-07-30 MED ORDER — MENTHOL 3 MG MT LOZG: 1.0000 | LOZENGE | OROMUCOSAL | Status: DC | PRN

## 2014-07-30 MED ORDER — NALOXONE HCL 1 MG/ML IJ SOLN: 1.0000 ug/kg/h | INTRAVENOUS | Status: DC | PRN

## 2014-07-30 MED ORDER — WITCH HAZEL-GLYCERIN EX PADS: 1.0000 "application " | MEDICATED_PAD | CUTANEOUS | Status: DC | PRN

## 2014-07-30 MED ORDER — PRENATAL MULTIVITAMIN CH
1.0000 | ORAL_TABLET | Freq: Every day | ORAL | Status: DC
  Administered 2014-07-31 – 2014-08-01 (×2): 1 via ORAL
  Filled 2014-07-30 (×2): qty 1

## 2014-07-30 MED ORDER — ONDANSETRON HCL 4 MG/2ML IJ SOLN: 4.0000 mg | Freq: Three times a day (TID) | INTRAMUSCULAR | Status: DC | PRN

## 2014-07-30 MED ORDER — OXYTOCIN 10 UNIT/ML IJ SOLN
INTRAMUSCULAR | Status: AC
  Filled 2014-07-30: qty 4

## 2014-07-30 MED ORDER — FENTANYL CITRATE 0.05 MG/ML IJ SOLN
INTRAMUSCULAR | Status: DC | PRN
Start: 2014-07-30 — End: 2014-07-30
  Administered 2014-07-30: 25 ug via INTRATHECAL

## 2014-07-30 MED ORDER — PHENYLEPHRINE 8 MG IN D5W 100 ML (0.08MG/ML) PREMIX OPTIME
INJECTION | INTRAVENOUS | Status: AC
  Filled 2014-07-30: qty 100

## 2014-07-30 MED ORDER — CEFAZOLIN SODIUM-DEXTROSE 2-3 GM-% IV SOLR: 2.0000 g | INTRAVENOUS | Status: DC

## 2014-07-30 MED ORDER — FENTANYL CITRATE 0.05 MG/ML IJ SOLN
INTRAMUSCULAR | Status: AC
  Filled 2014-07-30: qty 2

## 2014-07-30 MED ORDER — SCOPOLAMINE 1 MG/3DAYS TD PT72: 1.0000 | MEDICATED_PATCH | Freq: Once | TRANSDERMAL | Status: DC

## 2014-07-30 MED ORDER — ONDANSETRON HCL 4 MG/2ML IJ SOLN
INTRAMUSCULAR | Status: DC | PRN
  Administered 2014-07-30: 4 mg via INTRAVENOUS

## 2014-07-30 MED ORDER — OXYTOCIN 40 UNITS IN LACTATED RINGERS INFUSION - SIMPLE MED: 62.5000 mL/h | INTRAVENOUS | Status: AC

## 2014-07-30 MED ORDER — DIPHENHYDRAMINE HCL 25 MG PO CAPS: 25.0000 mg | ORAL_CAPSULE | ORAL | Status: DC | PRN

## 2014-07-30 MED ORDER — ZOLPIDEM TARTRATE 5 MG PO TABS: 5.0000 mg | ORAL_TABLET | Freq: Every evening | ORAL | Status: DC | PRN

## 2014-07-30 MED ORDER — OXYCODONE-ACETAMINOPHEN 5-325 MG PO TABS
1.0000 | ORAL_TABLET | ORAL | Status: DC | PRN
  Administered 2014-08-01 (×2): 1 via ORAL
  Filled 2014-07-30 (×2): qty 1

## 2014-07-30 MED ORDER — DEXAMETHASONE SODIUM PHOSPHATE 4 MG/ML IJ SOLN
INTRAMUSCULAR | Status: AC
  Filled 2014-07-30: qty 1

## 2014-07-30 MED ORDER — MORPHINE SULFATE 0.5 MG/ML IJ SOLN
INTRAMUSCULAR | Status: AC
Start: 2014-07-30 — End: 2014-07-30
  Filled 2014-07-30: qty 10

## 2014-07-30 MED ORDER — SENNOSIDES-DOCUSATE SODIUM 8.6-50 MG PO TABS
2.0000 | ORAL_TABLET | ORAL | Status: DC
  Administered 2014-07-30 – 2014-08-01 (×2): 2 via ORAL
  Filled 2014-07-30 (×2): qty 2

## 2014-07-30 MED ORDER — ONDANSETRON HCL 4 MG PO TABS: 4.0000 mg | ORAL_TABLET | ORAL | Status: DC | PRN

## 2014-07-30 MED ORDER — KETOROLAC TROMETHAMINE 30 MG/ML IJ SOLN
INTRAMUSCULAR | Status: AC
  Administered 2014-07-30: 30 mg via INTRAMUSCULAR
  Filled 2014-07-30: qty 1

## 2014-07-30 MED ORDER — KETOROLAC TROMETHAMINE 30 MG/ML IJ SOLN: 30.0000 mg | Freq: Four times a day (QID) | INTRAMUSCULAR | Status: AC | PRN

## 2014-07-30 MED ORDER — IBUPROFEN 600 MG PO TABS: 600.0000 mg | ORAL_TABLET | Freq: Four times a day (QID) | ORAL | Status: DC

## 2014-07-30 MED ORDER — MEPERIDINE HCL 25 MG/ML IJ SOLN: 6.2500 mg | INTRAMUSCULAR | Status: DC | PRN

## 2014-07-30 MED ORDER — NALBUPHINE HCL 10 MG/ML IJ SOLN: 5.0000 mg | Freq: Once | INTRAMUSCULAR | Status: AC | PRN

## 2014-07-30 MED ORDER — FENTANYL CITRATE 0.05 MG/ML IJ SOLN
INTRAMUSCULAR | Status: AC
  Administered 2014-07-30: 50 ug via INTRAVENOUS
  Filled 2014-07-30: qty 2

## 2014-07-30 MED ORDER — MORPHINE SULFATE (PF) 0.5 MG/ML IJ SOLN
INTRAMUSCULAR | Status: DC | PRN
  Administered 2014-07-30: .1 mg via INTRATHECAL

## 2014-07-30 MED ORDER — TETANUS-DIPHTH-ACELL PERTUSSIS 5-2.5-18.5 LF-MCG/0.5 IM SUSP: 0.5000 mL | Freq: Once | INTRAMUSCULAR | Status: DC

## 2014-07-30 MED ORDER — BUPIVACAINE HCL (PF) 0.5 % IJ SOLN
INTRAMUSCULAR | Status: DC | PRN
  Administered 2014-07-30: 20 mL

## 2014-07-30 MED ORDER — PHENYLEPHRINE 8 MG IN D5W 100 ML (0.08MG/ML) PREMIX OPTIME
INJECTION | INTRAVENOUS | Status: DC | PRN
  Administered 2014-07-30: 45 ug/min via INTRAVENOUS

## 2014-07-30 MED ORDER — SCOPOLAMINE 1 MG/3DAYS TD PT72
MEDICATED_PATCH | TRANSDERMAL | Status: DC
Start: 2014-07-30 — End: 2014-08-01
  Administered 2014-07-30: 1.5 mg via TRANSDERMAL
  Filled 2014-07-30: qty 1

## 2014-07-30 MED ORDER — IBUPROFEN 600 MG PO TABS
600.0000 mg | ORAL_TABLET | Freq: Four times a day (QID) | ORAL | Status: DC
  Administered 2014-07-30 – 2014-08-01 (×7): 600 mg via ORAL
  Filled 2014-07-30 (×7): qty 1

## 2014-07-30 MED ORDER — SODIUM CHLORIDE 0.9 % IJ SOLN: 3.0000 mL | INTRAMUSCULAR | Status: DC | PRN

## 2014-07-30 MED ORDER — SIMETHICONE 80 MG PO CHEW
80.0000 mg | CHEWABLE_TABLET | ORAL | Status: DC | PRN
  Administered 2014-07-31: 80 mg via ORAL

## 2014-07-30 MED ORDER — MAGNESIUM HYDROXIDE 400 MG/5ML PO SUSP: 30.0000 mL | ORAL | Status: DC | PRN

## 2014-07-30 MED ORDER — SCOPOLAMINE 1 MG/3DAYS TD PT72
1.0000 | MEDICATED_PATCH | Freq: Once | TRANSDERMAL | Status: DC
  Administered 2014-07-30: 1.5 mg via TRANSDERMAL

## 2014-07-30 MED ORDER — LANOLIN HYDROUS EX OINT: 1.0000 "application " | TOPICAL_OINTMENT | CUTANEOUS | Status: DC | PRN

## 2014-07-30 MED ORDER — DIPHENHYDRAMINE HCL 50 MG/ML IJ SOLN
12.5000 mg | INTRAMUSCULAR | Status: DC | PRN
Start: 2014-07-30 — End: 2014-08-01

## 2014-07-30 MED ORDER — BUPIVACAINE IN DEXTROSE 0.75-8.25 % IT SOLN
INTRATHECAL | Status: DC | PRN
  Administered 2014-07-30: 1.4 mL via INTRATHECAL

## 2014-07-30 MED ORDER — FENTANYL CITRATE 0.05 MG/ML IJ SOLN
25.0000 ug | INTRAMUSCULAR | Status: DC | PRN
  Administered 2014-07-30: 50 ug via INTRAVENOUS

## 2014-07-30 MED ORDER — KETOROLAC TROMETHAMINE 30 MG/ML IJ SOLN
30.0000 mg | Freq: Four times a day (QID) | INTRAMUSCULAR | Status: AC | PRN
  Administered 2014-07-30: 30 mg via INTRAMUSCULAR

## 2014-07-30 MED ORDER — OXYTOCIN 10 UNIT/ML IJ SOLN
40.0000 [IU] | INTRAVENOUS | Status: DC | PRN
  Administered 2014-07-30: 40 [IU] via INTRAVENOUS

## 2014-07-30 MED ORDER — DIPHENHYDRAMINE HCL 25 MG PO CAPS: 25.0000 mg | ORAL_CAPSULE | Freq: Four times a day (QID) | ORAL | Status: DC | PRN

## 2014-07-30 MED ORDER — LACTATED RINGERS IV SOLN
INTRAVENOUS | Status: DC
  Administered 2014-07-30 (×4): via INTRAVENOUS

## 2014-07-30 MED ORDER — ONDANSETRON HCL 4 MG/2ML IJ SOLN: 4.0000 mg | INTRAMUSCULAR | Status: DC | PRN

## 2014-07-30 MED ORDER — KETOROLAC TROMETHAMINE 30 MG/ML IJ SOLN: 15.0000 mg | Freq: Once | INTRAMUSCULAR | Status: DC | PRN

## 2014-07-30 MED ORDER — PROMETHAZINE HCL 25 MG/ML IJ SOLN: 6.2500 mg | INTRAMUSCULAR | Status: DC | PRN

## 2014-07-30 SURGICAL SUPPLY — 38 items
CLAMP CORD UMBIL (MISCELLANEOUS) IMPLANT
CLIP FILSHIE TUBAL LIGA STRL (Clip) ×2 IMPLANT
CLOTH BEACON ORANGE TIMEOUT ST (SAFETY) ×3 IMPLANT
COVER LIGHT HANDLE  1/PK (MISCELLANEOUS) ×4
COVER LIGHT HANDLE 1/PK (MISCELLANEOUS) ×2 IMPLANT
DRAPE SHEET LG 3/4 BI-LAMINATE (DRAPES) IMPLANT
DRSG OPSITE POSTOP 4X10 (GAUZE/BANDAGES/DRESSINGS) ×3 IMPLANT
DURAPREP 26ML APPLICATOR (WOUND CARE) ×3 IMPLANT
ELECT REM PT RETURN 9FT ADLT (ELECTROSURGICAL) ×3
ELECTRODE REM PT RTRN 9FT ADLT (ELECTROSURGICAL) ×1 IMPLANT
EXTRACTOR VACUUM M CUP 4 TUBE (SUCTIONS) ×1 IMPLANT
EXTRACTOR VACUUM M CUP 4' TUBE (SUCTIONS) ×1
GLOVE BIOGEL PI IND STRL 7.0 (GLOVE) ×1 IMPLANT
GLOVE BIOGEL PI INDICATOR 7.0 (GLOVE) ×2
GLOVE ECLIPSE 7.0 STRL STRAW (GLOVE) ×3 IMPLANT
GOWN STRL NON-REIN LRG LVL3 (GOWN DISPOSABLE) ×3 IMPLANT
GOWN STRL REUS W/TWL LRG LVL3 (GOWN DISPOSABLE) ×6 IMPLANT
KIT ABG SYR 3ML LUER SLIP (SYRINGE) IMPLANT
NDL HYPO 25X5/8 SAFETYGLIDE (NEEDLE) ×1 IMPLANT
NEEDLE HYPO 22GX1.5 SAFETY (NEEDLE) ×3 IMPLANT
NEEDLE HYPO 25X5/8 SAFETYGLIDE (NEEDLE) ×3 IMPLANT
NS IRRIG 1000ML POUR BTL (IV SOLUTION) ×3 IMPLANT
PACK C SECTION WH (CUSTOM PROCEDURE TRAY) ×3 IMPLANT
PAD ABD 7.5X8 STRL (GAUZE/BANDAGES/DRESSINGS) ×3 IMPLANT
PAD OB MATERNITY 4.3X12.25 (PERSONAL CARE ITEMS) ×3 IMPLANT
RTRCTR C-SECT PINK 25CM LRG (MISCELLANEOUS) IMPLANT
SPONGE GAUZE 4X4 12PLY STER LF (GAUZE/BANDAGES/DRESSINGS) ×2 IMPLANT
STAPLER VISISTAT 35W (STAPLE) IMPLANT
SUT PDS AB 0 CTX 36 PDP370T (SUTURE) ×2 IMPLANT
SUT PLAIN 2 0 XLH (SUTURE) IMPLANT
SUT VIC AB 0 CT1 36 (SUTURE) ×6 IMPLANT
SUT VIC AB 0 CTX 36 (SUTURE) ×6
SUT VIC AB 0 CTX36XBRD ANBCTRL (SUTURE) ×2 IMPLANT
SUT VIC AB 4-0 KS 27 (SUTURE) ×3 IMPLANT
SYR CONTROL 10ML LL (SYRINGE) ×3 IMPLANT
TOWEL OR 17X24 6PK STRL BLUE (TOWEL DISPOSABLE) ×3 IMPLANT
TRAY FOLEY CATH 14FR (SET/KITS/TRAYS/PACK) ×3 IMPLANT
WATER STERILE IRR 1000ML POUR (IV SOLUTION) ×3 IMPLANT

## 2014-07-30 NOTE — Anesthesia Preprocedure Evaluation (Signed)
Anesthesia Evaluation  Patient identified by MRN, date of birth, ID band Patient awake    Reviewed: Allergy & Precautions, H&P , NPO status , Patient's Chart, lab work & pertinent test results, reviewed documented beta blocker date and time   History of Anesthesia Complications Negative for: history of anesthetic complications  Airway       Dental  (+) Teeth Intact   Pulmonary former smoker,  breath sounds clear to auscultation        Cardiovascular hypertension, Rhythm:regular Rate:Normal     Neuro/Psych negative neurological ROS  negative psych ROS   GI/Hepatic negative GI ROS, Neg liver ROS,   Endo/Other  Morbid obesity  Renal/GU negative Renal ROS     Musculoskeletal   Abdominal   Peds  Hematology  (+) anemia ,   Anesthesia Other Findings   Reproductive/Obstetrics (+) Pregnancy (h/o prior C/S for repeat and BTL)                           Anesthesia Physical Anesthesia Plan  ASA: III  Anesthesia Plan: Spinal   Post-op Pain Management:    Induction:   Airway Management Planned:   Additional Equipment:   Intra-op Plan:   Post-operative Plan:   Informed Consent: I have reviewed the patients History and Physical, chart, labs and discussed the procedure including the risks, benefits and alternatives for the proposed anesthesia with the patient or authorized representative who has indicated his/her understanding and acceptance.     Plan Discussed with: Surgeon and CRNA  Anesthesia Plan Comments:         Anesthesia Quick Evaluation

## 2014-07-30 NOTE — Anesthesia Procedure Notes (Signed)

## 2014-07-30 NOTE — Progress Notes (Signed)
Resident Continuity Delivery  I have seen Ms Melin this morning. She is scheduled for repeat C/S by Dr. Macon LargeAnyanwu. I have participated in her prenatal care and will participate in her section as well as the baby's care and her postpartum care, for continuity.  Bobbye Mortonhristopher M Satoru Milich, MD PGY-3, [facility] 07/30/2014, 10:52 AM

## 2014-07-30 NOTE — Op Note (Addendum)
Cassandra Chandler PROCEDURE DATE: 07/30/2014  PREOPERATIVE DIAGNOSES: Intrauterine pregnancy at 54102w0d weeks gestation; previous cesarean section x 2; undesired fertility  POSTOPERATIVE DIAGNOSES: The same  PROCEDURE: Repeat Low Transverse Cesarean Section, Bilateral Tubal Sterilization using Filshie clips  SURGEON:  Dr. Jaynie CollinsUgonna Anyanwu  ASSISTANT: Dr. Maryjean Kahristopher Street  ANESTHESIOLOGIST: Dr. Cristela BlueKyle Jackson  INDICATIONS: Cassandra Chandler is a 33 y.o. [id number] at 16102w0d here for cesarean section and bilateral tubal sterilization secondary to the indications listed under preoperative diagnoses; please see preoperative note for further details.  The risks of surgery were discussed with the patient including but were not limited to: bleeding which may require transfusion or reoperation; infection which may require antibiotics; injury to bowel, bladder, ureters or other surrounding organs; injury to the fetus; need for additional procedures including hysterectomy in the event of a life-threatening hemorrhage; placental abnormalities wth subsequent pregnancies, incisional problems, thromboembolic phenomenon and other postoperative/anesthesia complications.  Patient also desires permanent sterilization.  Other reversible forms of contraception were discussed with patient; she declines all other modalities. Risks of procedure discussed with patient including but not limited to: risk of regret, permanence of method, bleeding, infection, injury to surrounding organs and need for additional procedures.  Failure risk of 1-2% with increased risk of ectopic gestation if pregnancy occurs was also discussed with patient.  The patient concurred with the proposed plan, giving informed written consent for the procedures.    FINDINGS:  Viable female infant in cephalic presentation.  Apgars 7 and 9.  Clear amniotic fluid.  Intact placenta, three vessel cord.  Normal uterus, fallopian tubes and ovaries bilaterally. Fallopian  tubes sterilized with Filshie clips bilaterally. Dense adhesions above the peritoneum; involving rectus muscles and fascia.  Minimal intraperitoneal adhesive disease. Of note, vacuum assistance was used to deliver the fetal head.  ANESTHESIA: Spinal INTRAVENOUS FLUIDS: 2300 ml ESTIMATED BLOOD LOSS: 700 ml URINE OUTPUT:  250 ml SPECIMENS: Placenta sent to L&D COMPLICATIONS: None immediate  PROCEDURE IN DETAIL:  The patient preoperatively received intravenous antibiotics and had sequential compression devices applied to her lower extremities.   She was then taken to the operating room where spinal anesthesia was administered and was found to be adequate. She was then placed in a dorsal supine position with a leftward tilt, and prepped and draped in a sterile manner.  A foley catheter was placed into her bladder and attached to constant gravity.  After an adequate timeout was performed, a Pfannenstiel skin incision was made with scalpel over her preexisting scars and carried through to the underlying layer of fascia. The fascia was incised in the midline, and this incision was extended bilaterally using the Mayo scissors.  Kocher clamps were applied to the superior aspect of the fascial incision and the underlying rectus muscles were dissected off sharply. A similar process was carried out on the inferior aspect of the fascial incision. The rectus muscles were separated in the midline bluntly and the peritoneum was entered bluntly. In order to maximize exposure, the rectus muscles were transected horizontally about 2 cm; epigastric vessels were not encountered.  Attention was turned to the lower uterine segment where a low transverse hysterotomy was made with a scalpel and extended bilaterally bluntly.  The infant was successfully delivered, the cord was clamped and cut and the infant was handed over to awaiting neonatology team. Uterine massage was then administered, and the placenta delivered intact with a  three-vessel cord. The uterus was then cleared of clot and debris.  The hysterotomy was closed  with 0 Vicryl in a running locked fashion, and an imbricating layer was also placed with 0 Vicryl.  Attention was then turned to the fallopian tubes, and Filshie clips were placed about 3 cm from the cornua, with care given to incorporate the underlying mesosalpinx on both sides, allowing for bilateral tubal sterilization. The pelvis was cleared of all clot and debris. Hemostasis was confirmed on all surfaces.  The fascia was then closed using 0 PDS in a running fashion.  The subcutaneous layer was irrigated, and 20 ml of 0.5% Marcaine was injected subcutaneously around the incision.  The skin was closed with a 4-0 Vicryl subcuticular stitch. The patient tolerated the procedure well. Sponge, lap, instrument and needle counts were correct x 2.  She was taken to the recovery room in stable condition.    Jaynie Collins, MD, FACOG Attending Obstetrician & Gynecologist Faculty Practice, [facility]

## 2014-07-30 NOTE — Lactation Note (Signed)
This note was copied from the chart of Cassandra Cathline Vanderwerf. Lactation Consultation Note  Patient Name: Cassandra Chandler'UToday's Date: 07/30/2014 Reason for consult: Initial assessment;Difficult latch.  Mom only attempted to breastfeed her first 2 daughters for a few days, tried pumping but gave up early on.  She reported having a period of engorgement but did not pump during the engorgement phase.  Mom has thick/everted nipples but baby has been tending to suck on upper lip and tongue.  LC assisted mom with latching to (L) breast in football position.  LC demonstrated hand expression and there was easily expressed drops of white colostrum.  Baby slips off when attempting to latch without NS but using the #24 NS, she achieves a deep latch and rhythmical sucking bursts and intermittent swallows. There is some thick drops of colostrum visible in NS when she slips off and she re-latches quickly again after 10 minutes.  Mom was shown how to support breast and apply NS.  LC encouraged frequent STS, cue feedings and mom to call for latch assistance as needed   Maternal Data Formula Feeding for Exclusion: Yes Reason for exclusion: Mother's choice to formula feed on admision Has patient been taught Hand Expression?: Yes (RN at 1718 and LC at this feeding; colostrum expressible) Does the patient have breastfeeding experience prior to this delivery?: Yes  Feeding Feeding Type: Breast Fed Length of feed: 10 min (remained latch after 10 minutes)  LATCH Score/Interventions Latch: Grasps breast easily, tongue down, lips flanged, rhythmical sucking. (unable to sustain latch without NS) Intervention(s): Assist with latch;Breast compression  Audible Swallowing: Spontaneous and intermittent Intervention(s): Skin to skin;Hand expression  Type of Nipple: Everted at rest and after stimulation  Comfort (Breast/Nipple): Soft / non-tender     Hold (Positioning): Assistance needed to correctly position infant  at breast and maintain latch. Intervention(s): Breastfeeding basics reviewed;Support Pillows;Position options;Skin to skin  LATCH Score: 9 (LC assisted and observed)  Lactation Tools Discussed/Used Tools: Nipple Shields Nipple shield size: 24 STS, cue feedings, signs of proper latch and milk transfer Hand expression  Consult Status Consult Status: Follow-up Date: 07/31/14 Follow-up type: In-patient    Warrick ParisianBryant, [facility] 07/30/2014, 10:50 PM

## 2014-07-30 NOTE — Transfer of Care (Signed)
Immediate Anesthesia Transfer of Care Note  Patient: Cassandra Chandler  Procedure(s) Performed: Procedure(s): CESAREAN SECTION WITH BILATERAL TUBAL LIGATION (Bilateral)  Patient Location: PACU  Anesthesia Type:Spinal  Level of Consciousness: awake, alert , oriented and patient cooperative  Airway & Oxygen Therapy: Patient Spontanous Breathing  Post-op Assessment: Report given to PACU RN and Post -op Vital signs reviewed and stable  Post vital signs: Reviewed and stable  Complications: No apparent anesthesia complications

## 2014-07-30 NOTE — Anesthesia Postprocedure Evaluation (Signed)
  Anesthesia Post-op Note  Patient: Cassandra Chandler  Procedure(s) Performed: Procedure(s): CESAREAN SECTION WITH BILATERAL TUBAL LIGATION (Bilateral)  Patient is awake, responsive, moving her legs, and has signs of resolution of her numbness. Pain and nausea are reasonably well controlled. Vital signs are stable and clinically acceptable. Oxygen saturation is clinically acceptable. There are no apparent anesthetic complications at this time. Patient is ready for discharge.

## 2014-07-30 NOTE — H&P (Signed)
Obstetric Preoperative History and Physical  Cassandra Chandler is a 33 y.o. [id number] with IUP at [redacted]w[redacted]d presenting for presenting for scheduled repeat cesarean section and bilateral tubal sterilization.  No acute concerns.   Prenatal Course Source of [place] Pregnancy complications or risks: Patient Active Problem List   Diagnosis Date Noted  . Uterine size date discrepancy, antepartum 07/21/2014  . Previous cesarean delivery, antepartum condition or complication 99/24/2683  . Supervision of other normal pregnancy 03/31/2014  . Borderline systolic HTN 41/96/2229  . ABNORMAL PAP SMEAR 12/25/2007  . OBESITY, CLASS III 11/26/2007   She plans to breastfeed, plans to bottle feed She desires bilateral tubal ligation for postpartum contraception.   Prenatal labs and studies: ABO, Rh: A/POS/-- (05/26 0844) Antibody: NEG (05/26 0844) Rubella: 2.01 (05/26 0844) RPR: NON REAC (07/23 1011)  HBsAg: NEGATIVE (05/26 0844)  HIV: NONREACTIVE (07/23 1011)  NLG:XQJJHERD (09/08 1653) 1 hr Glucola  Abnormla, normal 3 hr GTT Genetic screening normal Anatomy US normal   Past Medical History  Diagnosis Date  . Hypertension     Not on medication as of 03/2014  . Obesity     Past Surgical History  Procedure Laterality Date  . Cesarean section      As of 03/2014, x2 (failed induction, then repeat for failed TOLAC)    OB History  Gravida Para Term Preterm AB SAB TAB Ectopic Multiple Living  $Remov'6 2 2 'aWILqB$ 0 $Remo'3 1 2 'NePWK$ 0 0 2    # Outcome Date GA Lbr Len/2nd Weight Sex Delivery Anes PTL Lv  6 CUR           5 SAB           4 TAB           3 TAB           2 TRM  [redacted]w[redacted]d  6 lb (2.722 kg)  CS   Y     Comments: 42-week failed TOLAC, birth by repeat C-section  1 TRM  [redacted]w[redacted]d  6 lb (2.722 kg)  CS   Y     Comments: 42-week failed induction, C-section      History   Social History  . Marital Status: Single    Spouse Name: N/A    Number of Children: N/A  . Years of Education: N/A   Social  History Main Topics  . Smoking status: Former Research scientist (life sciences)  . Smokeless tobacco: None  . Alcohol Use: None  . Drug Use: No  . Sexual Activity: Yes    Birth Control/ Protection: IUD   Other Topics Concern  . 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  [employer].  Graduated from college.  Longer tem relationship - 7 years, father of children.    Family History  Problem Relation Age of Onset  . Hypertension Mother   . Anemia Mother   . Cancer Neg Hx   . Diabetes Neg Hx   . Stroke Neg Hx   . Heart disease Neg Hx     Prescriptions prior to admission  Medication Sig Dispense Refill  . Prenat w/o A-FeCb-FeGl-DSS-FA (CITRANATAL RX) 27-1 MG TABS Take 1 tablet by mouth daily.  30 each  10    No Known Allergies  Review of Systems: Negative except for what is mentioned in HPI.  Physical Exam: BP 167/96  Pulse 81  Temp(Src)  98.2 F (36.8 C) (Oral)  Resp 16  Ht $R'5\' 4"'kr$  (1.626 m)  SpO2 98%  LMP 10/30/2013 FHR by Doppler: 142 bpm GENERAL: Well-developed, well-nourished female in no acute distress.  LUNGS: Clear to auscultation bilaterally.  HEART: Regular rate and rhythm. ABDOMEN: Soft, nontender, nondistended, gravid, well-healed Pfannenstiel incision. PELVIC: Deferred EXTREMITIES: Nontender, no edema, 2+ distal pulses.   Pertinent Labs/Studies:   Results for orders placed during the hospital encounter of 07/30/14 (from the past 72 hour(s))  BASIC METABOLIC PANEL     Status: None   Collection Time    07/30/14  9:52 AM      Result Value Ref Range   Sodium 137  137 - 147 mEq/L   Potassium 4.3  3.7 - 5.3 mEq/L   Chloride 102  96 - 112 mEq/L   CO2 22  19 - 32 mEq/L   Glucose, Bld 82  70 - 99 mg/dL   BUN 8  6 - 23 mg/dL   Creatinine, Ser 0.52  0.50 - 1.10 mg/dL   Calcium 9.4  8.4 - 10.5 mg/dL   GFR calc non Af Amer >90  >90 mL/min   GFR calc Af Amer >90  >90 mL/min   Comment:  (NOTE)     The eGFR has been calculated using the CKD EPI equation.     This calculation has not been validated in all clinical situations.     eGFR's persistently <90 mL/min signify possible Chronic Kidney     Disease.   Anion gap 13  5 - 15  CBC     Status: Abnormal   Collection Time    07/30/14 10:10 AM      Result Value Ref Range   WBC 5.5  4.0 - 10.5 K/uL   RBC 3.90  3.87 - 5.11 MIL/uL   Hemoglobin 10.9 (*) 12.0 - 15.0 g/dL   HCT 32.9 (*) 36.0 - 46.0 %   MCV 84.4  78.0 - 100.0 fL   MCH 27.9  26.0 - 34.0 pg   MCHC 33.1  30.0 - 36.0 g/dL   RDW 14.5  11.5 - 15.5 %   Platelets 234  150 - 400 K/uL    Assessment and Plan :Cassandra Chandler is a 33 y.o. L8X2119 at [redacted]w[redacted]d being admitted being admitted for scheduled repeat cesarean section and BTS.  The risks of cesarean section were discussed with the patient including but were not limited to: bleeding which may require transfusion or reoperation; infection which may require antibiotics; injury to bowel, bladder, ureters or other surrounding organs; injury to the fetus; need for additional procedures including hysterectomy in the event of a life-threatening hemorrhage; placental abnormalities wth subsequent pregnancies, incisional problems, thromboembolic phenomenon and other postoperative/anesthesia complications.  Patient also desires permanent sterilization.  Other reversible forms of contraception were discussed with patient; she declines all other modalities. Risks of procedure discussed with patient including but not limited to: risk of regret, permanence of method, bleeding, infection, injury to surrounding organs and need for additional procedures.  Failure risk of 1-2% with increased risk of ectopic gestation if pregnancy occurs was also discussed with patient.  The patient concurred with the proposed plan, giving informed written consent for the procedures.  Patient has been NPO since last night she will remain NPO for procedure. Anesthesia  and OR aware.  Preoperative prophylactic antibiotics and SCDs ordered on call to the OR.  To OR when ready.  Of note, patient had borderline high BP during this pregnancy.  BP Readings from Last 3 Encounters:  07/30/14 167/96  07/30/14 167/96  07/27/14 132/78  Will check CMET today and urine Pr:Cr. No symptoms of preeclampsia.  Will continue to monitor closely.   Verita Schneiders, MD, [place]  Attending [place], [facility]

## 2014-07-30 NOTE — Addendum Note (Signed)
Addendum created 07/30/14 1722 by Orlie Pollenebra R Mckinsey Keagle, CRNA   Modules edited: Notes Section   Notes Section:  File: [id number]

## 2014-07-30 NOTE — Anesthesia Postprocedure Evaluation (Signed)
  Anesthesia Post-op Note  Patient: Cassandra Chandler  Procedure(s) Performed: Procedure(s): CESAREAN SECTION WITH BILATERAL TUBAL LIGATION (Bilateral)  Patient Location: PACU and Mother/Baby  Anesthesia Type:Spinal  Level of Consciousness: awake, alert , oriented and patient cooperative  Airway and Oxygen Therapy: Patient Spontanous Breathing  Post-op Pain: none  Post-op Assessment: Post-op Vital signs reviewed, Patient's Cardiovascular Status Stable, Respiratory Function Stable, Patent Airway, No signs of Nausea or vomiting, Adequate PO intake, Pain level controlled, No headache, No backache, No residual numbness and No residual motor weakness  Post-op Vital Signs: Reviewed and stable  Last Vitals:  Filed Vitals:   07/30/14 1603  BP: 126/76  Pulse: 66  Temp: 36.9 C  Resp: 22    Complications: No apparent anesthesia complications

## 2014-07-31 ENCOUNTER — Encounter (HOSPITAL_COMMUNITY): Payer: Self-pay | Admitting: Obstetrics & Gynecology

## 2014-07-31 LAB — CBC
HEMATOCRIT: 28.5 % — AB (ref 36.0–46.0)
Hemoglobin: 9.5 g/dL — ABNORMAL LOW (ref 12.0–15.0)
MCH: 27.8 pg (ref 26.0–34.0)
MCHC: 33.3 g/dL (ref 30.0–36.0)
MCV: 83.3 fL (ref 78.0–100.0)
Platelets: 204 10*3/uL (ref 150–400)
RBC: 3.42 MIL/uL — AB (ref 3.87–5.11)
RDW: 14.5 % (ref 11.5–15.5)
WBC: 10.1 10*3/uL (ref 4.0–10.5)

## 2014-07-31 LAB — BIRTH TISSUE RECOVERY COLLECTION (PLACENTA DONATION)

## 2014-07-31 NOTE — Lactation Note (Signed)
This note was copied from the chart of Girl Mardi Poke. Lactation Consultation Note  Patient Name: Girl Lars Mageilya Yandow ZOXWR'UToday's Date: 07/31/2014 Reason for consult: Follow-up assessment;Difficult latch LC spoke with RN, Clydie BraunKaren and mom, who reports that baby is latching with NS better if she inserts some ebm into NS prior to latch.  Curved-tip syringes were provided and mom has hand pump to use to obtain ebm for NS.  Most recent feeding with #24 NS and 3 ml's of ebm given with LATCH score of "7" per RN assessment.  LC encouraged mom to continue cue feedings, using NS and pre-pumping for ebm as needed.  LC also encouraged her to request latch assistance from nurse (and LC, if needed).   Maternal Data    Feeding Feeding Type: Breast Fed  LATCH Score/Interventions Latch: Grasps breast easily, tongue down, lips flanged, rhythmical sucking. Intervention(s): Adjust position;Assist with latch;Breast massage;Breast compression  Audible Swallowing: A few with stimulation Intervention(s): Skin to skin;Hand expression Intervention(s): Alternate breast massage  Type of Nipple: Flat Intervention(s): Hand pump;Reverse pressure (shiled)  Comfort (Breast/Nipple): Soft / non-tender     Hold (Positioning): Assistance needed to correctly position infant at breast and maintain latch. Intervention(s): Breastfeeding basics reviewed;Support Pillows;Position options;Skin to skin  LATCH Score: 7 (most recent LATCH score and feeding assessment at 1703  Lactation Tools Discussed/Used Tools: Nipple Dorris CarnesShields;Other (comment) (curved-tip syringe to insert ebm into nipple shield) Nipple shield size: 24 Cue feeding and pre-pumping with hand pump  Consult Status Consult Status: Follow-up Date: 08/01/14 Follow-up type: In-patient    Warrick ParisianBryant, Gwyn Mehring [facility] 07/31/2014, 6:25 PM

## 2014-07-31 NOTE — Progress Notes (Signed)
Subjective: Postpartum Day 1: Cesarean Delivery and BTL Patient reports incisional pain, tolerating PO, + flatus and no problems voiding.  Generally feels okay. Has some LE swelling still, no headaches or vision changes.  Objective: Vital signs in last 24 hours: Temp:  [97.9 F (36.6 C)-98.7 F (37.1 C)] 98.3 F (36.8 C) (10/02 0600) Pulse Rate:  [55-81] 76 (10/02 0600) Resp:  [13-22] 18 (10/02 0600) BP: (105-167)/(56-96) 106/68 mmHg (10/02 0600) SpO2:  [98 %-100 %] 100 % (10/02 0600) Weight:  [244 lb 14.4 oz (111.086 kg)] 244 lb 14.4 oz (111.086 kg) (10/01 1700)  Physical Exam:  General: alert, cooperative, appears stated age and no distress, appears tired Lochia: appropriate Uterine Fundus: firm Incision: healing well, no significant drainage, no dehiscence, no significant erythema DVT Evaluation: No evidence of DVT seen on physical exam. Negative Homan's sign. No cords or calf tenderness. Calf/Ankle edema is present.   Recent Labs  07/30/14 1010 07/31/14 0615  HGB 10.9* 9.5*  HCT 32.9* 28.5*    07/30/2014 14:20  Total Protein, Urine 4.1  Protein Creatinine Ratio 0.12  Creatinine, Urine 34.69    Assessment/Plan: Status post Cesarean section with BTL. Doing well postoperatively.  Hb stable. BP's normal, urine pro/Cr ratio as above. Has not required BP meds now or in the past.  Continue current care. Anticipate discharge Sunday 10/4.  Street, Christopher 07/31/2014, 8:03 AM  OB fellow attestation Post Partum Day 1 I have seen and examined this patient and agree with above documentation in the resident's note.   Cassandra Chandler is a 33 y.o. [id number] s/p rLTCS with BTL.  Pt denies problems with ambulating, voiding or po intake. Pain is well controlled. Method of Feeding: breast and bottle  PE:  BP 121/63  Pulse 90  Temp(Src) 98.4 F (36.9 C) (Oral)  Resp 19  Ht 5\' 4"  (1.626 m)  Wt 244 lb 14.4 oz (111.086 kg)  BMI 42.02 kg/m2  SpO2 99%  LMP 10/30/2013   Breastfeeding? Unknown Fundus firm, abdomen appropriately tender  Plan for discharge: tomorrow  Perry MountACOSTA,Glenn Gullickson ROCIO, MD 5:41 PM

## 2014-08-01 MED ORDER — OXYCODONE-ACETAMINOPHEN 5-325 MG PO TABS: 1.0000 | ORAL_TABLET | ORAL | Status: DC | PRN

## 2014-08-01 NOTE — Discharge Instructions (Signed)

## 2014-08-01 NOTE — Discharge Summary (Signed)
Obstetric Discharge Summary Reason for Admission: cesarean section (repeat for hx of C/S) Prenatal Procedures: none Intrapartum Procedures: cesarean: low cervical, transverse with vacuum-assisted extraction Postpartum Procedures: BTL with Filshie clips intraop during C/S Complications-Operative and Postpartum: none Hemoglobin  Date Value Ref Range Status  07/31/2014 9.5* 12.0 - 15.0 g/dL Final     HCT  Date Value Ref Range Status  07/31/2014 28.5* 36.0 - 46.0 % Final   Hospital Course: Pt is a 33 y.o. [id number] now delivered via rLTCS with BTL using Filshie clips, who was admitted for repeat section at 39w; anesthesia - spinal block by Dr. Jean RosenthalJackson. Extraction was difficult and required vacuum assistance but operation was otherwise uncomplicated. Post-op course has been uncomplicated. Pt has a hx of borderline HTN but has not required meds through the pregnancy or this admission; urine pro/Cr ratio was 0.12.  Physical Exam:  General: alert, cooperative and no distress Lochia: appropriate Uterine Fundus: firm Incision: healing well, no significant drainage, no dehiscence, no significant erythema DVT Evaluation: No evidence of DVT seen on physical exam. Negative Homan's sign. No cords or calf tenderness.  Discharge Diagnoses: Term Pregnancy-delivered  Discharge Information: Date: 08/01/2014 Activity: pelvic rest Diet: routine Medications: PNV and Ibuprofen Condition: stable Instructions: refer to practice specific booklet Discharge to: home Follow-up Information   Follow up with Maryjean KaStreet, Christopher, MD On 08/27/2014. (At 4 PM for post-partum check-up)    Specialty:  Family Medicine   Contact information:   [address] [place] [address] [phone]       Newborn Data: Live born female  Birth Weight: 9 lb 0.6 oz (4100 g) APGAR: 7, 9  Home with mother.  Street, Christopher 08/01/2014, 8:30 AM  OB fellow attestation Post Partum Day 2 I have seen and  examined this patient and agree with above documentation in the resident's note.   Cassandra Chandler is a 33 y.o. [id number] s/p rLTCS with BTL POD#2.  Pt denies problems with ambulating, voiding or po intake. Pain is well controlled.  Plan for birth control is s/p BTL.  Method of Feeding: breast and bottle  PE:  BP 131/65  Pulse 72  Temp(Src) 98.6 F (37 C) (Oral)  Resp 18  Ht 5\' 4"  (1.626 m)  Wt 244 lb 14.4 oz (111.086 kg)  BMI 42.02 kg/m2  SpO2 99%  LMP 10/30/2013  Breastfeeding? Unknown Fundus firm  Plan for discharge: today, [facility]   Cassandra Chandler,Cassandra Havrilla ROCIO, MD 9:57 PM

## 2014-08-01 NOTE — Progress Notes (Signed)
Subjective: Postpartum Day 1: Cesarean Delivery and BTL Patient reports incisional pain, tolerating PO, + flatus and no problems voiding.  Continues to feel well. Has more pain with getting up to walk and sitting back down.  Still some LE swelling, no headaches or vision changes.  Objective: Vital signs in last 24 hours: Temp:  [98.4 F (36.9 C)-98.9 F (37.2 C)] 98.6 F (37 C) (10/03 0616) Pulse Rate:  [72-90] 72 (10/03 0616) Resp:  [18-19] 18 (10/03 0616) BP: (109-131)/(57-65) 131/65 mmHg (10/03 0616) SpO2:  [99 %] 99 % (10/02 1320)  Physical Exam:  General: alert, cooperative, appears stated age and no distress Lochia: appropriate Uterine Fundus: firm Incision: healing well, no significant drainage, no dehiscence, no significant erythema DVT Evaluation: No evidence of DVT seen on physical exam. Negative Homan's sign. No cords or calf tenderness. Calf/Ankle edema is present.   Recent Labs  07/30/14 1010 07/31/14 0615  HGB 10.9* 9.5*  HCT 32.9* 28.5*    07/30/2014 14:20  Total Protein, Urine 4.1  Protein Creatinine Ratio 0.12  Creatinine, Urine 34.69    Assessment/Plan: Status post Cesarean section with BTL. Doing well postoperatively.  Hb stable. BP's normal, urine pro/Cr ratio as above. Has not required BP meds now or in the past.  Continue current care. Anticipate discharge tomorrow (Sunday 10/4) but may decide to go home later today.  Street, Christopher 08/01/2014, 8:27 AM   OB fellow attestation Post Partum Day 2 I have seen and examined this patient and agree with above documentation in the resident's note.   Samule Ohmilya S Brensinger is a 33 y.o. [id number] s/p rLTCS with BTL POD#2.  Pt denies problems with ambulating, voiding or po intake. Pain is well controlled.  Plan for birth control is s/p BTL.  Method of Feeding: breast  PE:  BP 131/65  Pulse 72  Temp(Src) 98.6 F (37 C) (Oral)  Resp 18  Ht 5\' 4"  (1.626 m)  Wt 244 lb 14.4 oz (111.086 kg)  BMI 42.02  kg/m2  SpO2 99%  LMP 10/30/2013  Breastfeeding? Unknown Fundus firm  Plan for discharge: possibly later today, patient undecided  Perry MountACOSTA,Sagar Tengan ROCIO, MD 11:50 PM

## 2014-08-04 ENCOUNTER — Encounter: Payer: 59 | Admitting: Family Medicine

## 2014-08-04 NOTE — Progress Notes (Signed)
Patient dropped off FMLA forms to be completed.  Please call her when ready.

## 2014-08-04 NOTE — Progress Notes (Signed)
Forms completed; pt needs to complete release of information / authorization forms -- areas of forms marked with Post-It notes for pt to complete. One copy of authorization form will need to be kept for our records / scanned after she signs it, along with a copy of the forms I completed and left paperclipped to copies of her notes (H&P, op note, and discharge summary from recent admission for repeat C/S plus BTL). Pt can pick up forms at her convenience. This information discussed in person with Darcella Cheshire. Martin, RN, and forms left with her as well. Thanks. --CMS

## 2014-08-04 NOTE — Progress Notes (Signed)
Forms placed in Dr. Timothy LassoStreet's box.

## 2014-08-05 NOTE — Progress Notes (Signed)
Left voice message for pt that form were completed by Dr. Casper HarrisonStreet.  Pt needs to sign and complete forms before she can received them.  Once pt has completed; forms need to be copied and scanned in pt's record.  Please review Dr. Timothy LassoStreet's note below.  Forms are placed up front.  Clovis PuMartin, Destinie Thornsberry L, RN

## 2014-08-20 IMAGING — US US OB FOLLOW-UP
1 series · 12 of 28 positions shown · non-contrast
Comparison: none

OBSTETRICS REPORT
                      (Signed Final 05/25/2014 [DATE])

Service(s) Provided
 US OB FOLLOW UP                                       76816.1
Indications
 Previous cesarean section x 2
 Maternal morbid obesity
 Follow-up incomplete fetal anatomic evaluation
Fetal Evaluation
 Num Of Fetuses:    1
 Fetal Heart Rate:  139                          bpm
 Cardiac Activity:  Observed
 Presentation:      Cephalic
 Placenta:          Anterior, above cervical os
 P. Cord            Visualized, central
 Insertion:
 Amniotic Fluid
 AFI FV:      Subjectively within normal limits
 AFI Sum:     18.23   cm       69  %Tile     Larg Pckt:    5.64  cm
 RUQ:   5.11    cm   RLQ:    2.72   cm    LUQ:   4.76    cm   LLQ:    5.64   cm
Biometry
 BPD:     73.4  mm     G. Age:  29w 3d                CI:        72.68   70 - 86
                                                      FL/HC:      20.5   19.2 -
 HC:     273.8  mm     G. Age:  29w 6d       25  %    HC/AC:      1.00   0.99 -
 AC:     273.2  mm     G. Age:  31w 3d       90  %    FL/BPD:     76.6   71 - 87
 FL:      56.2  mm     G. Age:  29w 4d       35  %    FL/AC:      20.6   20 - 24
 HUM:       50  mm     G. Age:  29w 2d       44  %
 Est. FW:    9911  gm      3 lb 8 oz     70  %
Gestational Age
 LMP:           29w 4d        Date:  10/30/13                 EDD:   08/06/14
 U/S Today:     30w 1d                                        EDD:   08/02/14
 Best:          29w 4d     Det. By:  LMP  (10/30/13)          EDD:   08/06/14
Anatomy
 Cranium:          Appears normal         Aortic Arch:      Appears normal
 Fetal Cavum:      Appears normal         Ductal Arch:      Not well visualized
 Ventricles:       Appears normal         Diaphragm:        Appears normal
 Choroid Plexus:   Previously seen        Stomach:          Appears normal, left
                                                            sided
 Cerebellum:       Previously seen        Abdomen:          Appears normal
 Posterior Fossa:  Previously seen        Abdominal Wall:   Previously seen
 Nuchal Fold:      Not applicable (>20    Cord Vessels:     Previously seen
                   wks GA)
 Face:             Orbits and profile     Kidneys:          Appear normal
                   previously seen
 Lips:             Previously seen        Bladder:          Appears normal
 Heart:            Not well visualized    Spine:            Appears normal
 RVOT:             Not well visualized    Lower             Previously seen
                                          Extremities:
 LVOT:             Not well visualized    Upper             Previously seen
 Other:  Fetus appears to be a female.  Heels and 5th digit previously seen.
         Technically difficult due to maternal habitus and fetal position.
Cervix Uterus Adnexa
 Cervical Length:    3.23     cm
 Cervix:       Not visualized (advanced GA >34wks)
 Uterus:       No abnormality visualized.
 Left Ovary:    Within normal limits.
 Right Ovary:   Within normal limits.
 Adnexa:     No abnormality visualized.
Impression
INDICATION: 32 yr old [id number] at 00w4d with obesity for follow
 up ultrasound to complete anatomic survey. Remote read.

[Series 1: us ob follow up · 83 acquisitions, 12 frames shown]
[im 4/83]
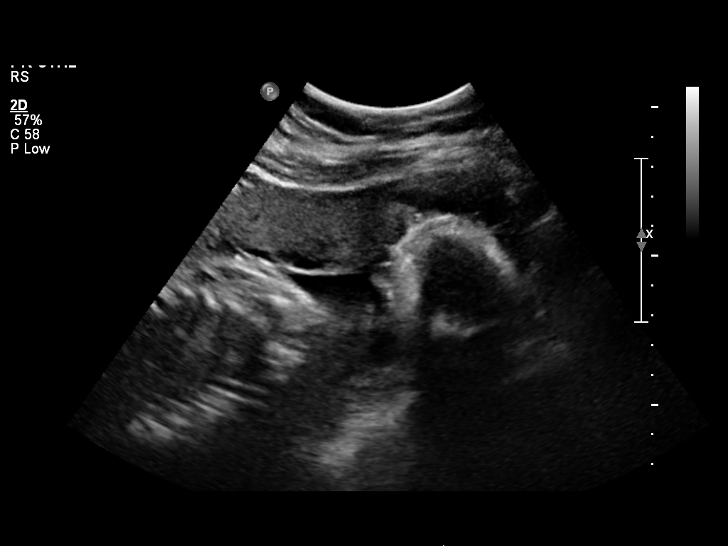
[im 10/83]
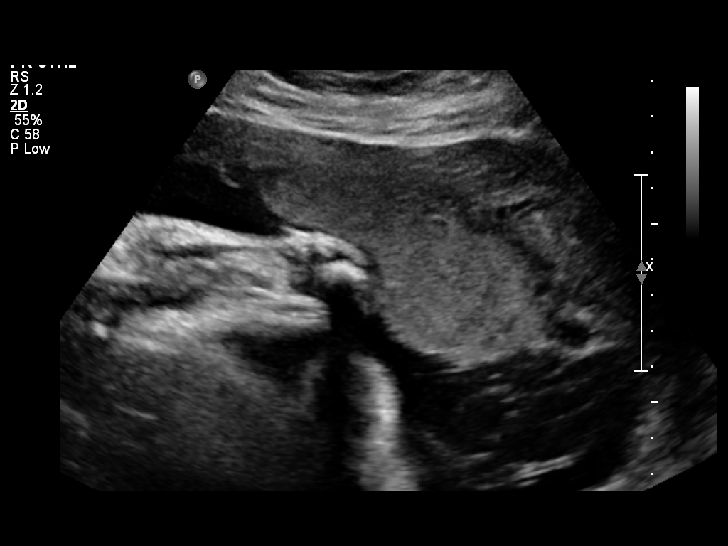
[im 16/83]
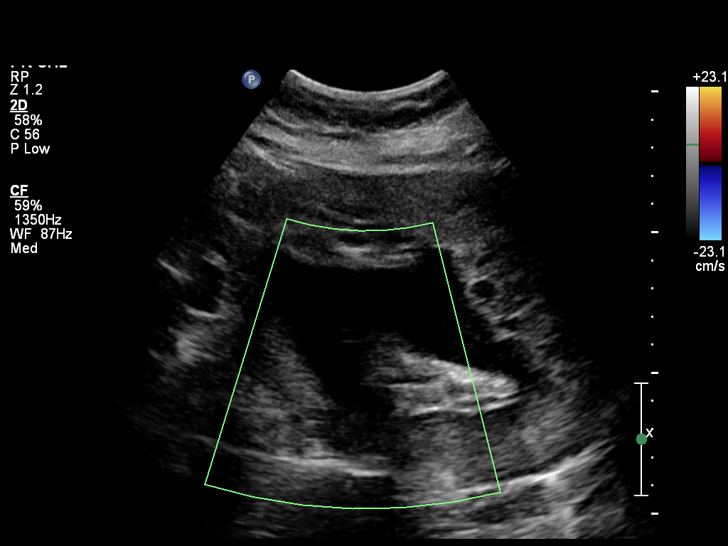
[im 25/83]
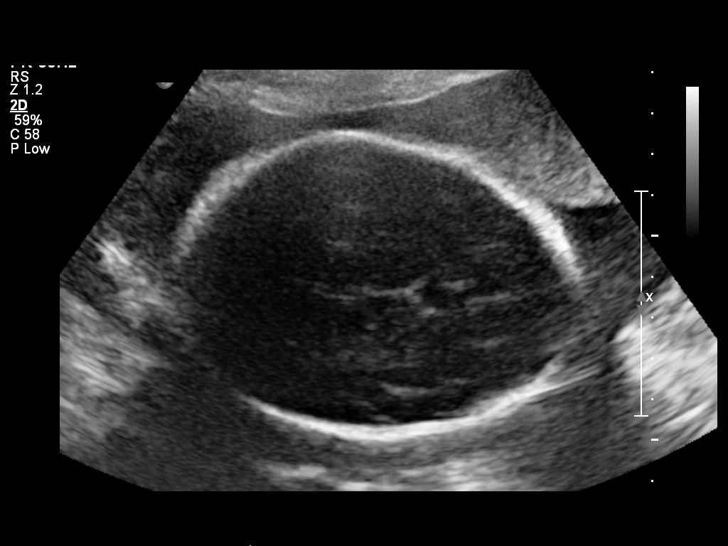
[im 31/83]
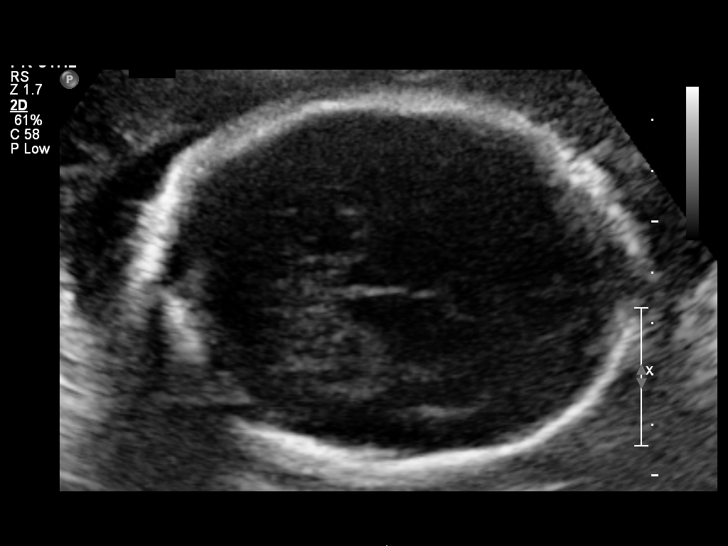
[im 37/83]
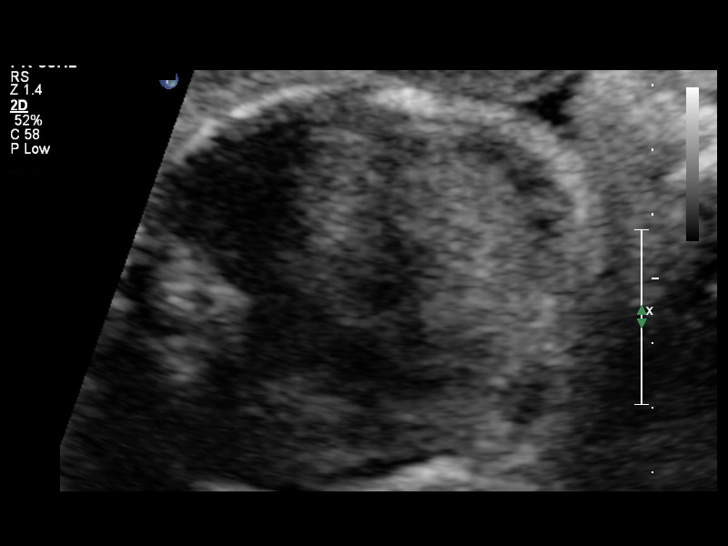
[im 46/83]
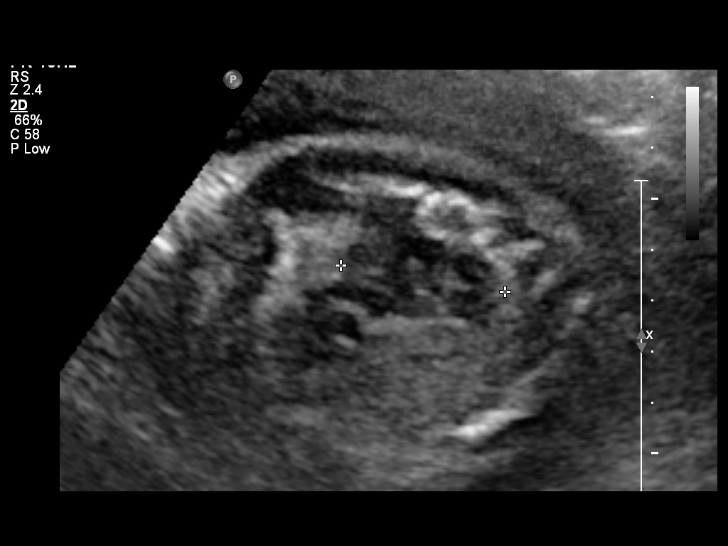
[im 52/83]
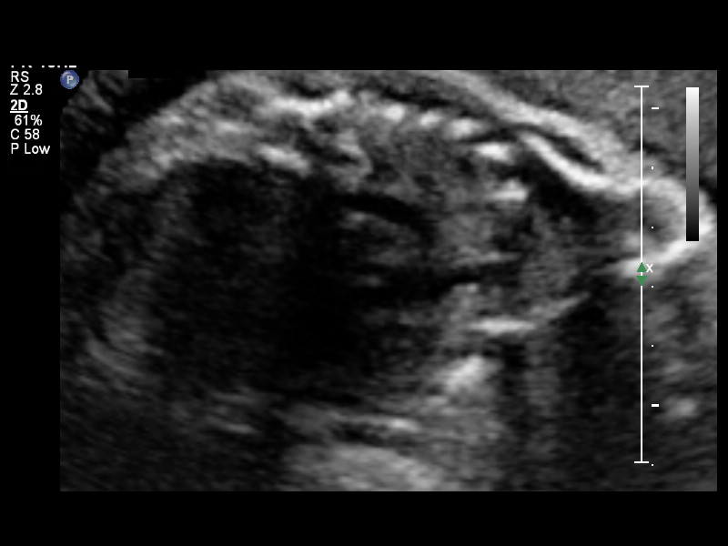
[im 58/83]
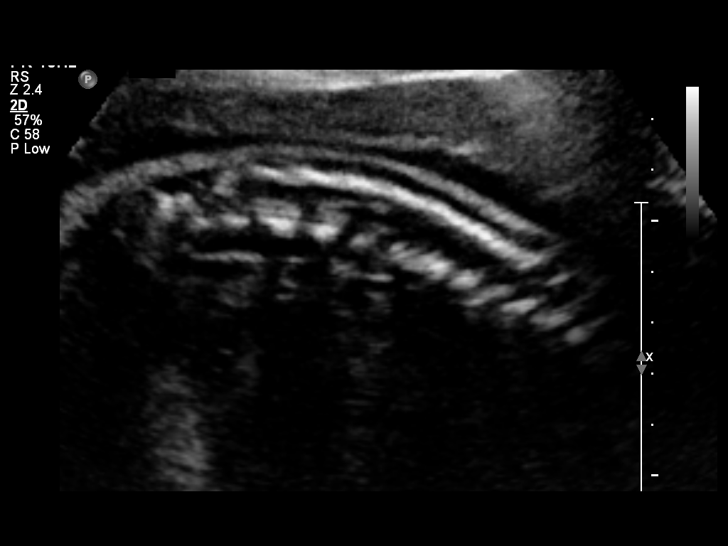
[im 67/83]
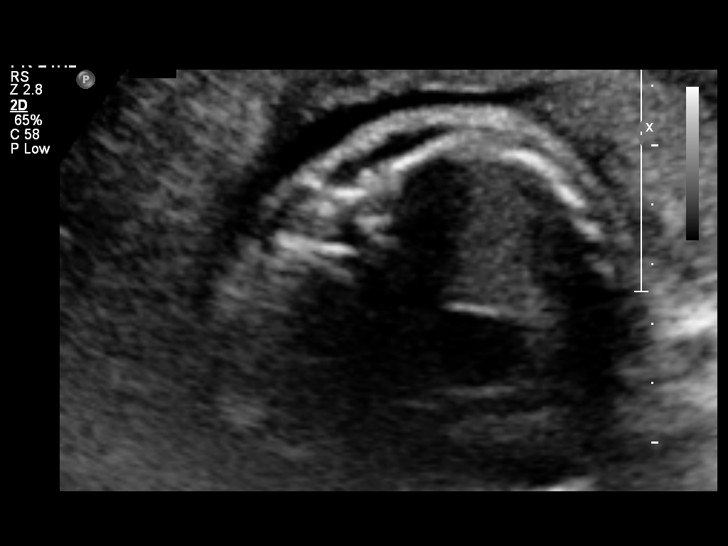
[im 73/83]
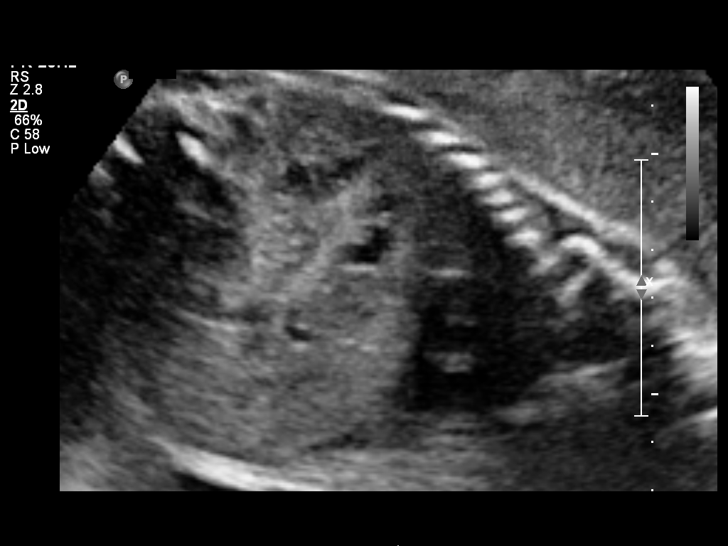
[im 79/83]
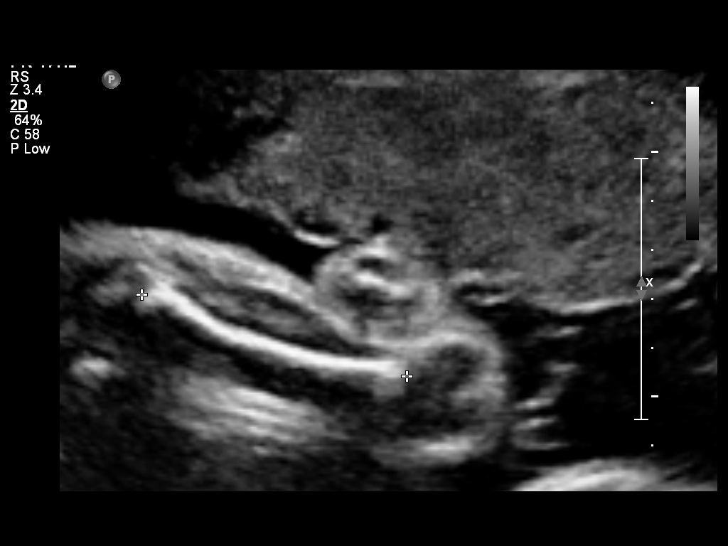

[12 of 28 positions shown; findings below may reference images not displayed]

FINDINGS: 1. Single intrauterine pregnancy.
 2. Estimated fetal weight is in the 70th%.
 3. Anterior placenta without evidence of previa.
 4. Normal amniotic fluid volume.
 5. Normal transabdominal cervical length.
 6. The views of the heart remain limited.
 7. The remainder of the limited anatomy survey is normal.
 Any anatomy not evaluated on today's exam was evaluated
 on the previous exam.
Recommendations

 1. Appropriate fetal growth.
 2. Limited anatomy survey:
 - recommend follow up in 4 weeks to reevaluate fetal heart
 - anatomy may remain limited given obesity

 questions or concerns.

## 2014-08-27 ENCOUNTER — Ambulatory Visit: Payer: 59 | Admitting: Family Medicine

## 2014-08-27 ENCOUNTER — Ambulatory Visit (INDEPENDENT_AMBULATORY_CARE_PROVIDER_SITE_OTHER): Payer: 59 | Admitting: Family Medicine

## 2014-08-27 ENCOUNTER — Encounter: Payer: Self-pay | Admitting: Family Medicine

## 2014-08-27 DIAGNOSIS — Z9851 Tubal ligation status: Secondary | ICD-10-CM | POA: Insufficient documentation

## 2014-08-27 NOTE — Patient Instructions (Signed)
Thank you for coming in, today!  Everything looks and sounds okay. I would recommend ibuprofen or Aleve in addition to your Percocet for pain. It's not unusual to still be having cramping now and then. If you need more help with pain, or if the pain gets worse or keeps coming back, come back to see Dr. Jennette KettleNeal or myself.  If you have severe bleeding, bad pain that medicine doesn't help, nausea, vomiting, or fever, call or come back sooner. If you feel like you have severe depression symptoms, if you feel like you are not bonding appropriately with the baby, or if you see things or hear things that aren't there, call us right away or go to the emergency room.  Please feel free to call with any questions or concerns at any time, at [phone]. --Dr. Casper HarrisonStreet

## 2014-08-27 NOTE — Progress Notes (Signed)
Subjective:     Cassandra Chandler is a 33 y.o. female who presents for a postpartum visit. She is 4 weeks postpartum following a low cervical transverse Cesarean section (repeat for hx x2). I have fully reviewed the prenatal and intrapartum course. The delivery was at 39 gestational weeks. Outcome: repeat cesarean section, low transverse incision. Anesthesia: spinal. Postpartum course has been uncomplicated other than some incisional pain and pain with sitting for extended periods of time; Tylenol and Percocet have helped with this. Baby's course has been uncomplicated and she is doing well. Baby is feeding by bottle - Similac Advanced. Bleeding slowly wore off, with no bleeding for about 1 week. Bowel function is normal, though sometimes with some discomfort. Bladder function is normal. Patient is sexually active. Contraception method: s/p tubal ligation; pt and her husband have been monogamous for 11 years. Postpartum depression screening: negative. She does have some mild blues, mostly due to poor sleep with a new baby. Denies poor bonding, frank anhedonia or depression, or AH / VH.  The following portions of the patient's history were reviewed and updated as appropriate: allergies, current medications, past family history, past medical history, past social history, past surgical history and problem list.  Review of Systems Pertinent items are noted in HPI.   Objective:    BP 115/83  Pulse 111  Temp(Src) 97.9 F (36.6 C) (Oral)  Ht 5\' 4"  (1.626 m)  Wt 217 lb (98.431 kg)  BMI 37.23 kg/m2  Manual recheck pulse 80's General:  alert, cooperative, appears stated age and no distress   Breasts:  negative  Lungs: clear to auscultation bilaterally  Heart:  regular rate and rhythm, S1, S2 normal, no murmur, click, rub or gallop  Abdomen: soft, non-tender; bowel sounds normal; no masses,  no organomegaly and well-healing surgical incision   Vulva:  negative for adhesions bilaterally, fissures  bilaterally, fusion bilaterally and swelling bilaterally  Vagina: normal external structures, no bleeding noted  Cervix:  not evaluated (pt declined full pelvic exam)  Corpus: not evaluated (pt declined full pelvic exam)  Adnexa:  not evaluated (pt declined full pelvic exam)  Rectal Exam: not evaluated (pt declined full pelvic exam)        Assessment:     Unremarkable postpartum exam. Pap smear not done at today's visit (normal <8 months ago).   Plan:    1. Contraception: tubal ligation 2. Possible mild postpartum blues vs simple fatigue due to poor sleep. Instructed to f/u PRN, especially if symptoms worsen. 3. Recommended Percocet for any severe pain and Tylenol or NSAIDs PRN for pain otherwise, as well as MiraLAX PRN for constipation. 4. Follow up with PCP Dr. Jennette KettleNeal PRN.

## 2014-08-31 ENCOUNTER — Encounter: Payer: Self-pay | Admitting: Family Medicine

## 2014-09-07 ENCOUNTER — Telehealth: Payer: Self-pay | Admitting: Family Medicine

## 2014-09-07 NOTE — Telephone Encounter (Signed)
Pt called and would like Dr. Jennette KettleNeal to call ASAP concerning a letter she needs written. She is on pregnancy leave and since she is still out her employer is requesting a letter from her doctor stating that. Please call patient to discuss dates. This is the information to be able to fax this to her employer  Fax number [phone] attention [employer] and the claim number is [id number]. Please call patient at [phone]. jw

## 2014-09-08 ENCOUNTER — Telehealth: Payer: Self-pay | Admitting: Family Medicine

## 2014-09-08 NOTE — Telephone Encounter (Signed)
Patient is concerned about some papers she left for Doctor's signature since she is in maternity leave. Please follow up with Patient.

## 2014-09-08 NOTE — Telephone Encounter (Signed)
Will forward to Dr. Casper HarrisonStreet, (see previous phone note)

## 2014-09-08 NOTE — Telephone Encounter (Signed)
I can fill it out but i amnot sure what she is requesting. Is it FMLA? If so, she has to do her part and getthe forms, fillout her parts, drop them off and ALSo tell me how much time she is wanting to take off. Please see if you can fiure out if this is what she is talking about THANKS! Denny LevySara Neal

## 2014-09-09 NOTE — Telephone Encounter (Signed)
Spoke with patient and she states that she has already picked up FMLA forms and would like them extended from 6 weeks to 8 weeks. Patient called yesterday about this and when I asked her how long until the 6 weeks run out she stated it runs out today. She was wanting us to update her chart so that it said she was still having pain and discomfort. I explained to her that we cannot just go into an old chart and add that if she has not been seen, especially if this is pertain to after pregnancy pain not getting better but worse. I told her I would send a message to PCP to see if there is anything we could do, but I could not promise anything due to what it is for. Last OV was 08/27/2014

## 2014-09-09 NOTE — Telephone Encounter (Signed)
Called pt to clarify. She is requesting extension for pregnancy leave from 6 weeks to 8 weeks and states a letter to her employer requesting this should suffice. Letter written with claim number on it, requesting extension of leave for 2 more weeks. Letter faxed to [phone]. Claim number is [id number] (corrected from above; needed additional 0 after first hyphen). --CMS

## 2014-09-09 NOTE — Telephone Encounter (Signed)
See phone note originally dated 11/9. --CMS

## 2014-09-10 ENCOUNTER — Telehealth: Payer: Self-pay | Admitting: Family Medicine

## 2014-09-10 MED ORDER — OXYCODONE-ACETAMINOPHEN 5-325 MG PO TABS: 1.0000 | ORAL_TABLET | Freq: Four times a day (QID) | ORAL | Status: DC | PRN

## 2014-09-10 NOTE — Telephone Encounter (Signed)
Pt requesting to speak to Dr. Casper HarrisonStreet about paperwork completed for her maternity leave. Pls call patient.

## 2014-09-10 NOTE — Telephone Encounter (Signed)
Called pt to discuss. Per pt, her work states they will be "reaching out to me," probably a phone call in the next week or so, to discuss her claim. 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 Advised pt that I will refill her Percocet for a short course and leave Rx up front, as this is helping her pain but she no longer has any from when she was discharged. Advised pt that she should resume regular follow up with Dr. Jennette KettleNeal as her PCP, as well, if she continues to have problems. I will keep an eye out for phone messages in order to communicate with pt's employer as needed. Thanks! --CMS  FYI to Dr. Jennette KettleNeal.

## 2014-12-30 ENCOUNTER — Ambulatory Visit (INDEPENDENT_AMBULATORY_CARE_PROVIDER_SITE_OTHER): Payer: 59 | Admitting: Family Medicine

## 2014-12-30 ENCOUNTER — Encounter: Payer: Self-pay | Admitting: Family Medicine

## 2014-12-30 ENCOUNTER — Other Ambulatory Visit (HOSPITAL_COMMUNITY)
Admission: RE | Admit: 2014-12-30 | Discharge: 2014-12-30 | Disposition: A | Discharge status: Home / Self Care | Payer: 59 | Source: Ambulatory Visit | Attending: Family Medicine | Admitting: Family Medicine

## 2014-12-30 VITALS — BP 143/87 | HR 94 | Temp 98.3°F | Ht 64.0 in | Wt 245.6 lb

## 2014-12-30 DIAGNOSIS — Z309 Encounter for contraceptive management, unspecified: Secondary | ICD-10-CM

## 2014-12-30 DIAGNOSIS — Z113 Encounter for screening for infections with a predominantly sexual mode of transmission: Secondary | ICD-10-CM | POA: Diagnosis present

## 2014-12-30 DIAGNOSIS — R5383 Other fatigue: Secondary | ICD-10-CM

## 2014-12-30 LAB — POCT WET PREP (WET MOUNT): Clue Cells Wet Prep Whiff POC: NEGATIVE

## 2014-12-30 LAB — CBC WITH DIFFERENTIAL/PLATELET
BASOS ABS: 0 10*3/uL (ref 0.0–0.1)
Basophils Relative: 0 % (ref 0–1)
Eosinophils Absolute: 0.1 10*3/uL (ref 0.0–0.7)
Eosinophils Relative: 2 % (ref 0–5)
HCT: 37.8 % (ref 36.0–46.0)
HEMOGLOBIN: 12 g/dL (ref 12.0–15.0)
Lymphocytes Relative: 53 % — ABNORMAL HIGH (ref 12–46)
Lymphs Abs: 3.2 10*3/uL (ref 0.7–4.0)
MCH: 27.6 pg (ref 26.0–34.0)
MCHC: 31.7 g/dL (ref 30.0–36.0)
MCV: 86.9 fL (ref 78.0–100.0)
MONOS PCT: 5 % (ref 3–12)
MPV: 10.5 fL (ref 8.6–12.4)
Monocytes Absolute: 0.3 10*3/uL (ref 0.1–1.0)
NEUTROS ABS: 2.4 10*3/uL (ref 1.7–7.7)
NEUTROS PCT: 40 % — AB (ref 43–77)
Platelets: 287 10*3/uL (ref 150–400)
RBC: 4.35 MIL/uL (ref 3.87–5.11)
RDW: 13.7 % (ref 11.5–15.5)
WBC: 6.1 10*3/uL (ref 4.0–10.5)

## 2014-12-30 LAB — POCT URINE PREGNANCY: Preg Test, Ur: NEGATIVE

## 2014-12-30 LAB — TSH: TSH: 0.886 u[IU]/mL (ref 0.350–4.500)

## 2014-12-30 NOTE — Patient Instructions (Signed)
I'm checking some blood work to make sure there is nothing going on that would account for your episodic fatigue. I will send you a note about that. I will also send you a note about the STD testing. Great to see you!

## 2014-12-31 ENCOUNTER — Encounter: Payer: Self-pay | Admitting: Family Medicine

## 2014-12-31 LAB — CERVICOVAGINAL ANCILLARY ONLY
Chlamydia: NEGATIVE
Neisseria Gonorrhea: NEGATIVE

## 2014-12-31 LAB — COMPREHENSIVE METABOLIC PANEL
ALK PHOS: 53 U/L (ref 39–117)
ALT: 14 U/L (ref 0–35)
AST: 11 U/L (ref 0–37)
Albumin: 4.1 g/dL (ref 3.5–5.2)
BILIRUBIN TOTAL: 0.2 mg/dL (ref 0.2–1.2)
BUN: 11 mg/dL (ref 6–23)
CO2: 23 meq/L (ref 19–32)
CREATININE: 0.43 mg/dL — AB (ref 0.50–1.10)
Calcium: 9 mg/dL (ref 8.4–10.5)
Chloride: 106 mEq/L (ref 96–112)
Glucose, Bld: 96 mg/dL (ref 70–99)
Potassium: 4.3 mEq/L (ref 3.5–5.3)
SODIUM: 138 meq/L (ref 135–145)
Total Protein: 7 g/dL (ref 6.0–8.3)

## 2014-12-31 LAB — RPR

## 2014-12-31 LAB — HIV ANTIBODY (ROUTINE TESTING W REFLEX): HIV 1&2 Ab, 4th Generation: NONREACTIVE

## 2014-12-31 NOTE — Progress Notes (Signed)
   Subjective:    Patient ID: Cassandra Chandler, female    DOB: 02/13/1981, 34 y.o.   MRN: [id number]  HPI #1. Wants to be tested for STDs. Has intermittently had some vaginal discharge but no unusual vaginal bleeding. #2. Fluttering: Has had no unusual fluttering sensation in her left lower quadrant several times over the last 2 weeks. Fluttering is similar to what she's noted previously with pregnancy but said different place. She's had no unusual change in her bowel habits, no constipation, no diarrhea, no weight loss, no abdominal pain. Status post BTL. #3. Had an episode last week of significant fatigue. Lasted several days. She's not sure she had a viral illness or something more significant is going on and wants testing.   Review of Systems See history of present illness. Additional pertinent review of systems is negative for unusual weight change, fever, sweats, chills. No unusual bruising. No headaches. No rash.    Objective:   Physical Exam  Vital signs reviewed GENERALl: Well developed, well nourished, in no acute distress. NECK: Supple, FROM, without lymphadenopathy.  THYROID: normal without nodularity CAROTID ARTERIES: without bruits LUNGS: clear to auscultation bilaterally. No wheezes or rales. HEART: Regular rate and rhythm, no murmurs ABDOMEN: soft with positive bowel sounds MSK: MOE x 4 SKIN no rash NEURO: no focal deficits GU: Externally normal without any sign of lesion. There is no vaginal discharge. The cervix is viewed with no problem and appears normal. No adnexal masses or tenderness although this exam is limited by habitus.      Assessment & Plan:  #1. Given her symptoms of "fluttering" will check your Criss Alvinerince he today although she has had both tubes tied. #2. Fatigue: Sounds like it was a brief episode of fatigue with resolution of symptoms but she's quite concerned about it so we'll check appropriate labs. #3. STD testing will be done at her request

## 2015-06-16 ENCOUNTER — Telehealth: Payer: Self-pay | Admitting: *Deleted

## 2015-06-16 NOTE — Telephone Encounter (Signed)
Returned call to patient regarding voice message that she left on nurse line for her menstrual cycle.  Left voice message for patient to return nurse call.  Clovis Pu, RN

## 2018-11-28 ENCOUNTER — Encounter (INDEPENDENT_AMBULATORY_CARE_PROVIDER_SITE_OTHER): Payer: 59

## 2018-12-12 ENCOUNTER — Ambulatory Visit (INDEPENDENT_AMBULATORY_CARE_PROVIDER_SITE_OTHER): Payer: 59 | Admitting: Family Medicine

## 2018-12-12 ENCOUNTER — Encounter (INDEPENDENT_AMBULATORY_CARE_PROVIDER_SITE_OTHER): Payer: Self-pay | Admitting: Family Medicine

## 2018-12-12 VITALS — BP 152/91 | HR 77 | Temp 97.4°F | Ht 64.0 in | Wt 239.0 lb

## 2018-12-12 DIAGNOSIS — R7303 Prediabetes: Secondary | ICD-10-CM

## 2018-12-12 DIAGNOSIS — R0602 Shortness of breath: Secondary | ICD-10-CM | POA: Diagnosis not present

## 2018-12-12 DIAGNOSIS — R5383 Other fatigue: Secondary | ICD-10-CM | POA: Diagnosis not present

## 2018-12-12 DIAGNOSIS — Z0289 Encounter for other administrative examinations: Secondary | ICD-10-CM

## 2018-12-12 DIAGNOSIS — E66813 Obesity, class 3: Secondary | ICD-10-CM

## 2018-12-12 DIAGNOSIS — Z9189 Other specified personal risk factors, not elsewhere classified: Secondary | ICD-10-CM | POA: Diagnosis not present

## 2018-12-12 DIAGNOSIS — E559 Vitamin D deficiency, unspecified: Secondary | ICD-10-CM | POA: Diagnosis not present

## 2018-12-12 DIAGNOSIS — Z6841 Body Mass Index (BMI) 40.0 and over, adult: Secondary | ICD-10-CM

## 2018-12-12 DIAGNOSIS — R030 Elevated blood-pressure reading, without diagnosis of hypertension: Secondary | ICD-10-CM

## 2018-12-12 DIAGNOSIS — Z1331 Encounter for screening for depression: Secondary | ICD-10-CM | POA: Diagnosis not present

## 2018-12-13 LAB — CBC WITH DIFFERENTIAL
BASOS: 0 %
Basophils Absolute: 0 10*3/uL (ref 0.0–0.2)
EOS (ABSOLUTE): 0 10*3/uL (ref 0.0–0.4)
EOS: 1 %
HEMATOCRIT: 37 % (ref 34.0–46.6)
Hemoglobin: 11.3 g/dL (ref 11.1–15.9)
Immature Grans (Abs): 0 10*3/uL (ref 0.0–0.1)
Immature Granulocytes: 0 %
LYMPHS ABS: 2.6 10*3/uL (ref 0.7–3.1)
Lymphs: 43 %
MCH: 24.1 pg — AB (ref 26.6–33.0)
MCHC: 30.5 g/dL — ABNORMAL LOW (ref 31.5–35.7)
MCV: 79 fL (ref 79–97)
Monocytes Absolute: 0.4 10*3/uL (ref 0.1–0.9)
Monocytes: 7 %
NEUTROS ABS: 3 10*3/uL (ref 1.4–7.0)
NEUTROS PCT: 49 %
RBC: 4.68 x10E6/uL (ref 3.77–5.28)
RDW: 15.6 % — AB (ref 11.7–15.4)
WBC: 6.1 10*3/uL (ref 3.4–10.8)

## 2018-12-13 LAB — COMPREHENSIVE METABOLIC PANEL
ALT: 13 IU/L (ref 0–32)
AST: 14 IU/L (ref 0–40)
Albumin/Globulin Ratio: 1.3 (ref 1.2–2.2)
Albumin: 4.4 g/dL (ref 3.8–4.8)
Alkaline Phosphatase: 81 IU/L (ref 39–117)
BILIRUBIN TOTAL: 0.3 mg/dL (ref 0.0–1.2)
BUN/Creatinine Ratio: 11 (ref 9–23)
BUN: 6 mg/dL (ref 6–20)
CHLORIDE: 102 mmol/L (ref 96–106)
CO2: 22 mmol/L (ref 20–29)
Calcium: 9.7 mg/dL (ref 8.7–10.2)
Creatinine, Ser: 0.53 mg/dL — ABNORMAL LOW (ref 0.57–1.00)
GFR, EST AFRICAN AMERICAN: 142 mL/min/{1.73_m2} (ref >59)
GFR, EST NON AFRICAN AMERICAN: 123 mL/min/{1.73_m2} (ref >59)
Globulin, Total: 3.3 g/dL (ref 1.5–4.5)
Glucose: 97 mg/dL (ref 65–99)
POTASSIUM: 4.3 mmol/L (ref 3.5–5.2)
Sodium: 139 mmol/L (ref 134–144)
Total Protein: 7.7 g/dL (ref 6.0–8.5)

## 2018-12-13 LAB — FOLATE: FOLATE: 10.4 ng/mL (ref >3.0)

## 2018-12-13 LAB — HEMOGLOBIN A1C
Est. average glucose Bld gHb Est-mCnc: 117 mg/dL
HEMOGLOBIN A1C: 5.7 % — AB (ref 4.8–5.6)

## 2018-12-13 LAB — LIPID PANEL WITH LDL/HDL RATIO
Cholesterol, Total: 200 mg/dL — ABNORMAL HIGH (ref 100–199)
HDL: 49 mg/dL (ref >39)
LDL CALC: 120 mg/dL — AB (ref 0–99)
LDL/HDL RATIO: 2.4 ratio (ref 0.0–3.2)
Triglycerides: 155 mg/dL — ABNORMAL HIGH (ref 0–149)
VLDL CHOLESTEROL CAL: 31 mg/dL (ref 5–40)

## 2018-12-13 LAB — TSH: TSH: 0.958 u[IU]/mL (ref 0.450–4.500)

## 2018-12-13 LAB — VITAMIN D 25 HYDROXY (VIT D DEFICIENCY, FRACTURES): Vit D, 25-Hydroxy: 6.4 ng/mL — ABNORMAL LOW (ref 30.0–100.0)

## 2018-12-13 LAB — T3: T3, Total: 122 ng/dL (ref 71–180)

## 2018-12-13 LAB — VITAMIN B12: Vitamin B-12: 519 pg/mL (ref 232–1245)

## 2018-12-13 LAB — T4, FREE: FREE T4: 1.09 ng/dL (ref 0.82–1.77)

## 2018-12-13 LAB — INSULIN, RANDOM: INSULIN: 24.8 u[IU]/mL (ref 2.6–24.9)

## 2018-12-16 NOTE — Progress Notes (Signed)
Office: [phone]  /  Fax: [phone]   Dear Dr. Cherly Hensen,   Thank you for referring KEUNDRA TERRERO to our clinic. The following note includes my evaluation and treatment recommendations.  HPI:   Chief Complaint: OBESITY    Lailyn S Dust has been referred by Maxie Better, MD for consultation regarding her obesity and obesity related comorbidities.    Cassandra Chandler (MR# [id number]) is a 38 y.o. female who presents on 12/12/2018 for obesity evaluation and treatment. Current BMI is Body mass index is 41.02 kg/m.Rutherford Nail has been struggling with her weight for many years and has been unsuccessful in either losing weight, maintaining weight loss, or reaching her healthy weight goal.     Isbella attended our information session and states she is currently in the action stage of change and ready to dedicate time achieving and maintaining a healthier weight. Javita is interested in becoming our patient and working on intensive lifestyle modifications including (but not limited to) diet, exercise and weight loss.    Monay states her family eats meals together she struggles with family and or coworkers weight loss sabotage her desired weight loss is 59 lbs she has been heavy most of  her life she started gaining weight at 38 years old her heaviest weight ever was 245 lbs she skips meals frequently she is frequently drinking liquids with calories she frequently makes poor food choices she frequently eats larger portions than normal  she struggles with emotional eating    Fatigue Ryin feels her energy is lower than it should be. This has worsened with weight gain and has not worsened recently. Desta admits to daytime somnolence and  admits to waking up still tired. Patient is at risk for obstructive sleep apnea. Patent has a history of symptoms of daytime fatigue. Patient generally gets 5 hours of sleep per night, and states they generally have generally restful sleep. Snoring is  present. Apneic episodes are not present. Epworth Sleepiness Score is 9.  Dyspnea on exertion Rohini notes increasing shortness of breath with exercising and seems to be worsening over time with weight gain. She notes getting out of breath sooner with activity than she used to. This has not gotten worse recently. EKG-Normal sinus rhythm at 80 BPM. Luba denies orthopnea.  Pre-Diabetes Saryiah has a diagnosis of pre-diabetes based on her elevated Hgb A1c. Last labs showing this diagnosis and she was informed this puts her at greater risk of developing diabetes. She is not taking metformin currently and continues to work on diet and exercise to decrease risk of diabetes. She denies nausea or hypoglycemia.  At risk for diabetes Danyeal is at higher than average risk for developing diabetes due to her obesity and pre-diabetes. She currently denies polyuria or polydipsia.  Vitamin D Deficiency Asmara has a diagnosis of vitamin D deficiency. She is not on OTC Vit D supplementations. She denies nausea, vomiting or muscle weakness.  Elevated Blood Pressure Kambree's blood pressure is elevated. She denies chest pain, chest pressure, or headaches. Her EKG shows normal sinus rhythm. She is not on medications. She is working on weight loss to help control her blood pressure with the goal of decreasing her risk of heart attack and stroke. Ossie's blood pressure is not currently controlled.  Depression Screen Taite's Food and Mood (modified PHQ-9) score was  Depression screen PHQ 2/9 12/12/2018  Decreased Interest 2  Down, Depressed, Hopeless 1  PHQ - 2 Score 3  Altered sleeping 0  Tired, decreased energy  2  Change in appetite 2  Feeling bad or failure about yourself  0  Trouble concentrating 1  Moving slowly or fidgety/restless 0  Suicidal thoughts 0  PHQ-9 Score 8  Difficult doing work/chores Not difficult at all    ASSESSMENT AND PLAN:  Other fatigue - Plan: EKG 12-Lead, Vitamin B12, CBC With  Differential, Folate, T3, T4, free, TSH  Shortness of breath on exertion - Plan: Lipid Panel With LDL/HDL Ratio  Prediabetes - Plan: Comprehensive metabolic panel, Hemoglobin A1c, Insulin, random  Vitamin D deficiency - Plan: VITAMIN D 25 Hydroxy (Vit-D Deficiency, Fractures)  Elevated blood pressure reading  Depression screening  At risk for diabetes mellitus  Class 3 severe obesity with serious comorbidity and body mass index (BMI) of 40.0 to 44.9 in adult, unspecified obesity type (HCC)  PLAN:  Fatigue Tajai was informed that her fatigue may be related to obesity, depression or many other causes. Labs will be ordered, and in the meanwhile My has agreed to work on diet, exercise and weight loss to help with fatigue. Proper sleep hygiene was discussed including the need for 7-8 hours of quality sleep each night. A sleep study was not ordered based on symptoms and Epworth score.  Dyspnea on exertion Pearl's shortness of breath appears to be obesity related and exercise induced. She has agreed to work on weight loss and gradually increase exercise to treat her exercise induced shortness of breath. If Devonne follows our instructions and loses weight without improvement of her shortness of breath, we will plan to refer to pulmonology. We will monitor this condition regularly. Hayleigh agrees to this plan.  Pre-Diabetes Rutherford Naililya will continue to work on weight loss, exercise, and decreasing simple carbohydrates in her diet to help decrease the risk of diabetes. We dicussed metformin including benefits and risks. She was informed that eating too many simple carbohydrates or too many calories at one sitting increases the likelihood of GI side effects. Darwin declined metformin for now and a prescription was not written today. We will check Hgb A1c and Insulin today. Taci agrees to follow up with our clinic in 2 weeks as directed to monitor her progress.  Diabetes risk counseling Tamura was given  extended (15 minutes) diabetes prevention counseling today. She is 38 y.o. female and has risk factors for diabetes including obesity and pre-diabetes. We discussed intensive lifestyle modifications today with an emphasis on weight loss as well as increasing exercise and decreasing simple carbohydrates in her diet.  Vitamin D Deficiency Janasha was informed that low vitamin D levels contributes to fatigue and are associated with obesity, breast, and colon cancer. She will follow up for routine testing of vitamin D, at least 2-3 times per year. She was informed of the risk of over-replacement of vitamin D and agrees to not increase her dose unless she discusses this with us first. We will check Vit D level today. Aanchal agrees to follow up with our clinic in 2 weeks.  Elevated Blood Pressure We discussed sodium restriction, working on healthy weight loss, and a regular exercise program as the means to achieve improved blood pressure control. Glenisha agreed with this plan and agreed to follow up as directed. We will continue to monitor her blood pressure as well as her progress with the above lifestyle modifications. We will follow up on blood pressure at next appointment. She will watch for signs of hypotension as she continues her lifestyle modifications. Aleshka agrees to follow up with our clinic in 2 weeks.  Depression Screen Ailis had a mildly positive depression screening. Depression is commonly associated with obesity and often results in emotional eating behaviors. We will monitor this closely and work on CBT to help improve the non-hunger eating patterns. Referral to Psychology may be required if no improvement is seen as she continues in our clinic.  Obesity Margrett is currently in the action stage of change and her goal is to continue with weight loss efforts. I recommend Finesse begin the structured treatment plan as follows:  She has agreed to follow the Category 2 plan + 100 calories Shammara has been  instructed to eventually work up to a goal of 150 minutes of combined cardio and strengthening exercise per week for weight loss and overall health benefits. We discussed the following Behavioral Modification Strategies today: increasing lean protein intake, increasing vegetables, work on meal planning and easy cooking plans, no skipping meals, and planning for success   She was informed of the importance of frequent follow up visits to maximize her success with intensive lifestyle modifications for her multiple health conditions. She was informed we would discuss her lab results at her next visit unless there is a critical issue that needs to be addressed sooner. Neelam agreed to keep her next visit at the agreed upon time to discuss these results.  ALLERGIES: No Known Allergies  MEDICATIONS: No current outpatient medications on file prior to visit.   No current facility-administered medications on file prior to visit.     PAST MEDICAL HISTORY: Past Medical History:  Diagnosis Date  . Alcohol abuse   . Back pain   . Chest pain   . Hypertension    Not on medication as of 03/2014  . Obesity   . Prediabetes   . Swelling   . Vitamin D deficiency     PAST SURGICAL HISTORY: Past Surgical History:  Procedure Laterality Date  . CESAREAN SECTION     As of 03/2014, x2 (failed induction, then repeat for failed TOLAC)  . CESAREAN SECTION WITH BILATERAL TUBAL LIGATION Bilateral 07/30/2014   Procedure: CESAREAN SECTION WITH BILATERAL TUBAL LIGATION;  Surgeon: Tereso Newcomer, MD;  Location: [facility];  Service: Obstetrics;  Laterality: Bilateral;    SOCIAL HISTORY: Social History   Tobacco Use  . Smoking status: Former Games developer  . Smokeless tobacco: Never Used  Substance Use Topics  . Alcohol use: Not on file    Comment: 2-3 times weekly  . Drug use: Yes    Types: Marijuana    FAMILY HISTORY: Family History  Problem Relation Age of Onset  . Hypertension Mother   . Anemia Mother   .  Obesity Mother   . Hypertension Father   . Alcoholism Father   . Obesity Father   . Cancer Neg Hx   . Diabetes Neg Hx   . Stroke Neg Hx   . Heart disease Neg Hx     ROS: Review of Systems  Constitutional: Positive for malaise/fatigue. Negative for weight loss.  Eyes:       + Wear glasses or contacts  Respiratory: Positive for shortness of breath (with exertion).   Cardiovascular: Negative for chest pain and orthopnea.       Negative chest pressure  Gastrointestinal: Negative for nausea and vomiting.  Genitourinary: Negative for frequency.  Musculoskeletal: Positive for back pain.       Negative muscle weakness  Skin: Positive for itching.  Neurological: Negative for headaches.  Endo/Heme/Allergies: Negative for polydipsia.  Negative hypoglycemia  Psychiatric/Behavioral:       + Stress    PHYSICAL EXAM: Blood pressure (!) 152/91, pulse 77, temperature (!) 97.4 F (36.3 C), temperature source Oral, height 5\' 4"  (1.626 m), weight 239 lb (108.4 kg), last menstrual period 11/21/2018, SpO2 97 %, unknown if currently breastfeeding. Body mass index is 41.02 kg/m. Physical Exam Vitals signs reviewed.  Constitutional:      Appearance: Normal appearance. She is obese.  HENT:     Head: Normocephalic and atraumatic.     Nose: Nose normal.  Eyes:     General: No scleral icterus.    Extraocular Movements: Extraocular movements intact.  Neck:     Musculoskeletal: Normal range of motion and neck supple.     Comments: No thyromegaly present Cardiovascular:     Rate and Rhythm: Normal rate and regular rhythm.     Pulses: Normal pulses.     Heart sounds: Normal heart sounds.  Pulmonary:     Effort: Pulmonary effort is normal. No respiratory distress.     Breath sounds: Normal breath sounds.  Abdominal:     Palpations: Abdomen is soft.     Tenderness: There is no abdominal tenderness.     Comments: + Obesity  Musculoskeletal: Normal range of motion.     Right lower leg: No  edema.     Left lower leg: No edema.  Skin:    General: Skin is warm and dry.  Neurological:     Mental Status: She is alert and oriented to person, place, and time.     Coordination: Coordination normal.  Psychiatric:        Mood and Affect: Mood normal.        Behavior: Behavior normal.     RECENT LABS AND TESTS: BMET    Component Value Date/Time   NA 139 12/12/2018 1028   K 4.3 12/12/2018 1028   CL 102 12/12/2018 1028   CO2 22 12/12/2018 1028   GLUCOSE 97 12/12/2018 1028   GLUCOSE 96 12/30/2014 0947   GLUCOSE 89 05/12/2014 1049   BUN 6 12/12/2018 1028   CREATININE 0.53 (L) 12/12/2018 1028   CREATININE 0.43 (L) 12/30/2014 0947   CALCIUM 9.7 12/12/2018 1028   GFRNONAA 123 12/12/2018 1028   GFRAA 142 12/12/2018 1028   Lab Results  Component Value Date   HGBA1C 5.7 (H) 12/12/2018   Lab Results  Component Value Date   INSULIN 24.8 12/12/2018   CBC    Component Value Date/Time   WBC 6.1 12/12/2018 1028   WBC 6.1 12/30/2014 0947   RBC 4.68 12/12/2018 1028   RBC 4.35 12/30/2014 0947   HGB 11.3 12/12/2018 1028   HCT 37.0 12/12/2018 1028   PLT 287 12/30/2014 0947   MCV 79 12/12/2018 1028   MCH 24.1 (L) 12/12/2018 1028   MCH 27.6 12/30/2014 0947   MCHC 30.5 (L) 12/12/2018 1028   MCHC 31.7 12/30/2014 0947   RDW 15.6 (H) 12/12/2018 1028   LYMPHSABS 2.6 12/12/2018 1028   MONOABS 0.3 12/30/2014 0947   EOSABS 0.0 12/12/2018 1028   BASOSABS 0.0 12/12/2018 1028   Iron/TIBC/Ferritin/ %Sat No results found for: IRON, TIBC, FERRITIN, IRONPCTSAT Lipid Panel     Component Value Date/Time   CHOL 200 (H) 12/12/2018 1028   TRIG 155 (H) 12/12/2018 1028   HDL 49 12/12/2018 1028   CHOLHDL 3.5 06/01/2011 0839   VLDL 19 06/01/2011 0839   LDLCALC 120 (H) 12/12/2018 1028   Hepatic Function Panel  Component Value Date/Time   PROT 7.7 12/12/2018 1028   ALBUMIN 4.4 12/12/2018 1028   AST 14 12/12/2018 1028   ALT 13 12/12/2018 1028   ALKPHOS 81 12/12/2018 1028    BILITOT 0.3 12/12/2018 1028   BILIDIR <0.2 07/30/2014 1010   IBILI NOT CALCULATED 07/30/2014 1010      Component Value Date/Time   TSH 0.958 12/12/2018 1028   TSH 0.886 12/30/2014 0947   TSH 1.540 06/01/2011 0839    ECG  shows NSR with a rate of 80 BPM INDIRECT CALORIMETER done today shows a VO2 of 271 and a REE of 1885.  Her calculated basal metabolic rate is 9147 thus her basal metabolic rate is worse than expected.       OBESITY BEHAVIORAL INTERVENTION VISIT  Today's visit was # 1   Starting weight: 239 lbs Starting date: 12/12/2018 Today's weight : 239 lbs Today's date: 12/12/2018 Total lbs lost to date: 0    ASK: We discussed the diagnosis of obesity with Cecil S Kuechle today and Cindia agreed to give [place] permission to discuss obesity behavioral modification therapy today.  ASSESS: Artia has the diagnosis of obesity and her BMI today is 69 Krysti is in the action stage of change   ADVISE: Daylani was educated on the multiple health risks of obesity as well as the benefit of weight loss to improve her health. She was advised of the need for long term treatment and the importance of lifestyle modifications to improve her current health and to decrease her risk of future health problems.  AGREE: Multiple dietary modification options and treatment options were discussed and  Ladeidra agreed to follow the recommendations documented in the above note.  ARRANGE: Corda was educated on the importance of frequent visits to treat obesity as outlined per CMS and USPSTF guidelines and agreed to schedule her next follow up appointment today.  I, Burt Knack , am acting as transcriptionist for Debbra Riding, MD  I have reviewed the above documentation for accuracy and completeness, and I agree with the above. - Debbra Riding, MD

## 2018-12-25 ENCOUNTER — Encounter (INDEPENDENT_AMBULATORY_CARE_PROVIDER_SITE_OTHER): Payer: Self-pay

## 2018-12-26 ENCOUNTER — Ambulatory Visit (INDEPENDENT_AMBULATORY_CARE_PROVIDER_SITE_OTHER): Payer: 59 | Admitting: Family Medicine

## 2018-12-26 ENCOUNTER — Encounter (INDEPENDENT_AMBULATORY_CARE_PROVIDER_SITE_OTHER): Payer: Self-pay

## 2019-01-01 ENCOUNTER — Encounter (INDEPENDENT_AMBULATORY_CARE_PROVIDER_SITE_OTHER): Payer: Self-pay | Admitting: Family Medicine

## 2019-01-01 ENCOUNTER — Ambulatory Visit (INDEPENDENT_AMBULATORY_CARE_PROVIDER_SITE_OTHER): Payer: 59 | Admitting: Family Medicine

## 2019-01-01 VITALS — BP 144/89 | HR 84 | Ht 64.0 in | Wt 233.0 lb

## 2019-01-01 DIAGNOSIS — E7849 Other hyperlipidemia: Secondary | ICD-10-CM

## 2019-01-01 DIAGNOSIS — I10 Essential (primary) hypertension: Secondary | ICD-10-CM | POA: Diagnosis not present

## 2019-01-01 DIAGNOSIS — R7303 Prediabetes: Secondary | ICD-10-CM | POA: Diagnosis not present

## 2019-01-01 DIAGNOSIS — E559 Vitamin D deficiency, unspecified: Secondary | ICD-10-CM | POA: Diagnosis not present

## 2019-01-01 DIAGNOSIS — Z6841 Body Mass Index (BMI) 40.0 and over, adult: Secondary | ICD-10-CM

## 2019-01-01 DIAGNOSIS — Z9189 Other specified personal risk factors, not elsewhere classified: Secondary | ICD-10-CM | POA: Diagnosis not present

## 2019-01-01 MED ORDER — VITAMIN D (ERGOCALCIFEROL) 1.25 MG (50000 UNIT) PO CAPS: 50000.0000 [IU] | ORAL_CAPSULE | ORAL | 0 refills | Status: DC

## 2019-01-01 MED ORDER — CHLORTHALIDONE 25 MG PO TABS
12.5000 mg | ORAL_TABLET | Freq: Every day | ORAL | 0 refills | Status: DC
Start: 2019-01-01 — End: 2022-07-13

## 2019-01-02 NOTE — Progress Notes (Signed)
Office: [phone]  /  Fax: [phone]   HPI:   Chief Complaint: OBESITY Cassandra Chandler is here to discuss her progress with her obesity treatment plan. She is on the Category 2 plan + 100 calories and is following her eating plan approximately 85-90 % of the time. 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 Cassandra Chandler tried to stick to the meal plan as much as possible. She did indulge in 1-2 chicken wings on Friday night. She denies hunger. She did yogurt for breakfast, a sandwich for lunch, and getting in around 6 oz of meat at dinner.  Her weight is 233 lb (105.7 kg) today and has had a weight loss of 6 pounds over a period of 3 weeks since her last visit. She has lost 6 lbs since starting treatment with [place].  Vitamin D Deficiency Cassandra Chandler has a diagnosis of vitamin D deficiency. She is not on OTC Vit D replacement. She notes fatigue and denies nausea, vomiting or muscle weakness.  At risk for osteopenia and osteoporosis Cassandra Chandler is at higher risk of osteopenia and osteoporosis due to vitamin D deficiency.   Pre-Diabetes Cassandra Chandler has a diagnosis of pre-diabetes based on her elevated Hgb A1c at 5.7, and has had this diagnosis for a couple of months. She was informed this puts her at greater risk of developing diabetes. She is not taking metformin currently and notes carbohydrate cravings for fried food. She continues to work on diet and exercise to decrease risk of diabetes. She denies nausea or hypoglycemia.  Hyperlipidemia Cassandra Chandler has hyperlipidemia and has been trying to improve her cholesterol levels with intensive lifestyle modification including a low saturated fat diet, exercise and weight loss. Her LDL was 120 and HDL was 49. She notes previously indulging in fried foods. She denies any chest pain, claudication or myalgias.  Hypertension Cassandra Chandler is a 38 y.o. female with hypertension. Cassandra Chandler's blood pressure is elevated today. She has had previous elevation. She denies  chest pain, chest pressure, or headaches. She is working on weight loss to help control her blood pressure with the goal of decreasing her risk of heart attack and stroke. Cassandra Chandler's blood pressure is not currently controlled.  ASSESSMENT AND PLAN:  Vitamin D deficiency - Plan: Vitamin D, Ergocalciferol, (DRISDOL) 1.25 MG (50000 UT) CAPS capsule  Prediabetes  Other hyperlipidemia  Essential hypertension - Plan: chlorthalidone (HYGROTON) 25 MG tablet  At risk for osteoporosis  Class 3 severe obesity with serious comorbidity and body mass index (BMI) of 40.0 to 44.9 in adult, unspecified obesity type (HCC)  PLAN:  Vitamin D Deficiency Cassandra Chandler was informed that low vitamin D levels contributes to fatigue and are associated with obesity, breast, and colon cancer. Cassandra Chandler agrees to start prescription Vit D @50 ,000 IU every week #4 with no refills. She will follow up for routine testing of vitamin D, at least 2-3 times per year. She was informed of the risk of over-replacement of vitamin D and agrees to not increase her dose unless she discusses this with [place] first. [name] agrees to follow up with our clinic in 2 weeks.  At risk for osteopenia and osteoporosis Cassandra Chandler was given extended (30 minutes) osteoporosis prevention counseling today. Cassandra Chandler is at risk for osteopenia and osteoporsis due to her vitamin D deficiency. She was encouraged to take her vitamin D and follow her higher calcium diet and increase strengthening exercise to help strengthen her bones and decrease her risk of osteopenia and osteoporosis.  Pre-Diabetes  Cassandra Chandler will continue her Category 2 plan, and will continue to work on weight loss, exercise, and decreasing simple carbohydrates in her diet to help decrease the risk of diabetes. We dicussed metformin including benefits and risks. She was informed that eating too many simple carbohydrates or too many calories at one sitting increases the likelihood of GI side effects. Cassandra Chandler declined  metformin for now and a prescription was not written today. We will retest Hgb A1c and insulin in 3 months. Cassandra Chandler agrees to follow up with our clinic in 2 weeks as directed to monitor her progress.  Hyperlipidemia Cassandra Chandler was informed of the American Heart Association Guidelines emphasizing intensive lifestyle modifications as the first line treatment for hyperlipidemia. We discussed many lifestyle modifications today in depth, and Cassandra Chandler will continue to work on decreasing saturated fats such as fatty red meat, butter and many fried foods. She will also increase vegetables and lean protein in her diet and continue to work on exercise and weight loss efforts. We will retest FLP in 3 months. Cassandra Chandler agrees to follow up with our clinic in 2 weeks.  Hypertension We discussed sodium restriction, working on healthy weight loss, and a regular exercise program as the means to achieve improved blood pressure control. Cassandra Chandler agreed with this plan and agreed to follow up as directed. We will continue to monitor her blood pressure as well as her progress with the above lifestyle modifications. Cassandra Chandler agrees to start chlorthalidone 12.5 mg PO daily #30 with no refills. She will watch for signs of hypotension as she continues her lifestyle modifications. Cassandra Chandler agrees to follow up with our clinic in 2 weeks.  Obesity Cassandra Chandler is currently in the action stage of change. As such, her goal is to continue with weight loss efforts She has agreed to follow the Category 2 plan + 100 calories Cassandra Chandler has been instructed to work up to a goal of 150 minutes of combined cardio and strengthening exercise per week for weight loss and overall health benefits. We discussed the following Behavioral Modification Strategies today: increasing lean protein intake, increasing vegetables and work on meal planning and easy cooking plans, and planning for success   Cassandra Chandler has agreed to follow up with our clinic in 2 weeks. She was informed of the  importance of frequent follow up visits to maximize her success with intensive lifestyle modifications for her multiple health conditions.  ALLERGIES: No Known Allergies  MEDICATIONS: No current outpatient medications on file prior to visit.   No current facility-administered medications on file prior to visit.     PAST MEDICAL HISTORY: Past Medical History:  Diagnosis Date  . Alcohol abuse   . Back pain   . Chest pain   . Hypertension    Not on medication as of 03/2014  . Obesity   . Prediabetes   . Swelling   . Vitamin D deficiency     PAST SURGICAL HISTORY: Past Surgical History:  Procedure Laterality Date  . CESAREAN SECTION     As of 03/2014, x2 (failed induction, then repeat for failed TOLAC)  . CESAREAN SECTION WITH BILATERAL TUBAL LIGATION Bilateral 07/30/2014   Procedure: CESAREAN SECTION WITH BILATERAL TUBAL LIGATION;  Surgeon: Tereso Newcomer, MD;  Location: [facility];  Service: Obstetrics;  Laterality: Bilateral;    SOCIAL HISTORY: Social History   Tobacco Use  . Smoking status: Former Games developer  . Smokeless tobacco: Never Used  Substance Use Topics  . Alcohol use: Not on file    Comment: 2-3  times weekly  . Drug use: Yes    Types: Marijuana    FAMILY HISTORY: Family History  Problem Relation Age of Onset  . Hypertension Mother   . Anemia Mother   . Obesity Mother   . Hypertension Father   . Alcoholism Father   . Obesity Father   . Cancer Neg Hx   . Diabetes Neg Hx   . Stroke Neg Hx   . Heart disease Neg Hx     ROS: Review of Systems  Constitutional: Positive for malaise/fatigue and weight loss.  Cardiovascular: Negative for chest pain and claudication.       Negative chest pressure  Gastrointestinal: Negative for nausea and vomiting.  Musculoskeletal: Negative for myalgias.       Negative muscle weakness  Neurological: Negative for headaches.  Endo/Heme/Allergies:       Negative hypoglycemia    PHYSICAL EXAM: Blood pressure (!) 144/89,  pulse 84, height 5\' 4"  (1.626 m), weight 233 lb (105.7 kg), last menstrual period 12/23/2018, SpO2 99 %, unknown if currently breastfeeding. Body mass index is 39.99 kg/m. Physical Exam Vitals signs reviewed.  Constitutional:      Appearance: Normal appearance. She is obese.  Cardiovascular:     Rate and Rhythm: Normal rate.     Pulses: Normal pulses.  Pulmonary:     Effort: Pulmonary effort is normal.     Breath sounds: Normal breath sounds.  Musculoskeletal: Normal range of motion.  Skin:    General: Skin is warm and dry.  Neurological:     Mental Status: She is alert and oriented to person, place, and time.  Psychiatric:        Mood and Affect: Mood normal.        Behavior: Behavior normal.     RECENT LABS AND TESTS: BMET    Component Value Date/Time   NA 139 12/12/2018 1028   K 4.3 12/12/2018 1028   CL 102 12/12/2018 1028   CO2 22 12/12/2018 1028   GLUCOSE 97 12/12/2018 1028   GLUCOSE 96 12/30/2014 0947   GLUCOSE 89 05/12/2014 1049   BUN 6 12/12/2018 1028   CREATININE 0.53 (L) 12/12/2018 1028   CREATININE 0.43 (L) 12/30/2014 0947   CALCIUM 9.7 12/12/2018 1028   GFRNONAA 123 12/12/2018 1028   GFRAA 142 12/12/2018 1028   Lab Results  Component Value Date   HGBA1C 5.7 (H) 12/12/2018   Lab Results  Component Value Date   INSULIN 24.8 12/12/2018   CBC    Component Value Date/Time   WBC 6.1 12/12/2018 1028   WBC 6.1 12/30/2014 0947   RBC 4.68 12/12/2018 1028   RBC 4.35 12/30/2014 0947   HGB 11.3 12/12/2018 1028   HCT 37.0 12/12/2018 1028   PLT 287 12/30/2014 0947   MCV 79 12/12/2018 1028   MCH 24.1 (L) 12/12/2018 1028   MCH 27.6 12/30/2014 0947   MCHC 30.5 (L) 12/12/2018 1028   MCHC 31.7 12/30/2014 0947   RDW 15.6 (H) 12/12/2018 1028   LYMPHSABS 2.6 12/12/2018 1028   MONOABS 0.3 12/30/2014 0947   EOSABS 0.0 12/12/2018 1028   BASOSABS 0.0 12/12/2018 1028   Iron/TIBC/Ferritin/ %Sat No results found for: IRON, TIBC, FERRITIN, IRONPCTSAT Lipid Panel       Component Value Date/Time   CHOL 200 (H) 12/12/2018 1028   TRIG 155 (H) 12/12/2018 1028   HDL 49 12/12/2018 1028   CHOLHDL 3.5 06/01/2011 0839   VLDL 19 06/01/2011 0839   LDLCALC 120 (H) 12/12/2018 1028  Hepatic Function Panel     Component Value Date/Time   PROT 7.7 12/12/2018 1028   ALBUMIN 4.4 12/12/2018 1028   AST 14 12/12/2018 1028   ALT 13 12/12/2018 1028   ALKPHOS 81 12/12/2018 1028   BILITOT 0.3 12/12/2018 1028   BILIDIR <0.2 07/30/2014 1010   IBILI NOT CALCULATED 07/30/2014 1010      Component Value Date/Time   TSH 0.958 12/12/2018 1028   TSH 0.886 12/30/2014 0947   TSH 1.540 06/01/2011 0839      OBESITY BEHAVIORAL INTERVENTION VISIT  Today's visit was # 2   Starting weight: 239 lbs Starting date: 12/12/2018 Today's weight : 233 lbs  Today's date: 01/01/2019 Total lbs lost to date: 6    01/01/2019  Height  (1.626 m)  Weight 233 lb (105.7 kg)  BMI (Calculated) 39.97  BLOOD PRESSURE - SYSTOLIC 144  BLOOD PRESSURE - DIASTOLIC 89   Body Fat % 42.1 %  Total Body Water (lbs) 87.8 lbs     ASK: We discussed the diagnosis of obesity with Abryanna S Mandato today and Navie agreed to give [place] permission to discuss obesity behavioral modification therapy today.  ASSESS: Nala has the diagnosis of obesity and her BMI today is 39.97 Darah is in the action stage of change   ADVISE: Berdia was educated on the multiple health risks of obesity as well as the benefit of weight loss to improve her health. She was advised of the need for long term treatment and the importance of lifestyle modifications to improve her current health and to decrease her risk of future health problems.  AGREE: Multiple dietary modification options and treatment options were discussed and  Tajae agreed to follow the recommendations documented in the above note.  ARRANGE: Lisandra was educated on the importance of frequent visits to treat obesity as outlined per CMS and USPSTF  guidelines and agreed to schedule her next follow up appointment today.  I, Burt Knack, am acting as transcriptionist for Debbra Riding, MD  I have reviewed the above documentation for accuracy and completeness, and I agree with the above. - Debbra Riding, MD

## 2019-01-06 ENCOUNTER — Ambulatory Visit (INDEPENDENT_AMBULATORY_CARE_PROVIDER_SITE_OTHER): Payer: 59 | Admitting: Family Medicine

## 2019-01-21 ENCOUNTER — Encounter (INDEPENDENT_AMBULATORY_CARE_PROVIDER_SITE_OTHER): Payer: Self-pay

## 2019-01-22 ENCOUNTER — Ambulatory Visit (INDEPENDENT_AMBULATORY_CARE_PROVIDER_SITE_OTHER): Payer: 59 | Admitting: Physician Assistant

## 2019-01-22 ENCOUNTER — Encounter (INDEPENDENT_AMBULATORY_CARE_PROVIDER_SITE_OTHER): Payer: Self-pay | Admitting: Physician Assistant

## 2019-01-22 ENCOUNTER — Other Ambulatory Visit: Payer: Self-pay

## 2019-01-22 DIAGNOSIS — Z6839 Body mass index (BMI) 39.0-39.9, adult: Secondary | ICD-10-CM | POA: Diagnosis not present

## 2019-01-22 DIAGNOSIS — E559 Vitamin D deficiency, unspecified: Secondary | ICD-10-CM

## 2019-01-23 ENCOUNTER — Encounter (INDEPENDENT_AMBULATORY_CARE_PROVIDER_SITE_OTHER): Payer: Self-pay

## 2019-01-23 NOTE — Progress Notes (Addendum)
Office: [phone]  /  Fax: [phone] TeleHealth Visit:  Cassandra Chandler has consented to this TeleHealth visit today via telephone call. The patient is located at home, the provider is located at the [facility] office. The participants in this visit include the listed provider and patient and any and all parties involved.  HPI:   Chief Complaint: OBESITY Cassandra Chandler is here to discuss her progress with her obesity treatment plan. She is on the Category 2 plan +100 calories and is following her eating plan approximately 40 to 50 % of the time. She states she is exercising 0 minutes 0 times per week. Cassandra Chandler reports that she has had difficulty sticking with the plan, due to being home with her kids recently, instead of being at work. Cassandra Chandler is slightly bored with the plan. We were unable to weight the patient today for this TeleHealth visit.She feels as if she has gained 2 lbs since her last visit. She has lost 4 lbs since starting treatment with [place].  Vitamin D deficiency Cassandra Chandler has a diagnosis of vitamin D deficiency. She is currently taking vit D and denies nausea, vomiting or muscle weakness.  ASSESSMENT AND PLAN:  Vitamin D deficiency - Plan: Vitamin D, Ergocalciferol, (DRISDOL) 1.25 MG (50000 UT) CAPS capsule  Class 2 severe obesity with serious comorbidity and body mass index (BMI) of 39.0 to 39.9 in adult, unspecified obesity type (HCC) 1 PLAN:  Vitamin D Deficiency Cassandra Chandler was informed that low vitamin D levels contributes to fatigue and are associated with obesity, breast, and colon cancer. She agrees to continue to take prescription Vit D @50 ,000 IU every week #4 with no refills  and will follow up for routine testing of vitamin D, at least 2-3 times per year. She was informed of the risk of over-replacement of vitamin D and agrees to not increase her dose unless she discusses this with [place] first. Cassandra Chandler agrees to follow up as directed.  I spent > than 50% of the 25  minute visit on counseling as documented in the note.  Obesity Cassandra Chandler is currently in the action stage of change. As such, her goal is to continue with weight loss efforts She has agreed to follow the Category 2 plan Cassandra Chandler has been instructed to work up to a goal of 150 minutes of combined cardio and strengthening exercise per week for weight loss and overall health benefits. We discussed the following Behavioral Modification Strategies today: keeping healthy foods in the home, work on meal planning and easy cooking plans  Cassandra Chandler has agreed to follow up with our clinic in 2 weeks. She was informed of the importance of frequent follow up visits to maximize her success with intensive lifestyle modifications for her multiple health conditions.  ALLERGIES: No Known Allergies  MEDICATIONS: Current Outpatient Medications on File Prior to Visit  Medication Sig Dispense Refill  . chlorthalidone (HYGROTON) 25 MG tablet Take 0.5 tablets (12.5 mg total) by mouth daily. 30 tablet 0  . Vitamin D, Ergocalciferol, (DRISDOL) 1.25 MG (50000 UT) CAPS capsule Take 1 capsule (50,000 Units total) by mouth every 7 (seven) days. 4 capsule 0   No current facility-administered medications on file prior to visit.     PAST MEDICAL HISTORY: Past Medical History:  Diagnosis Date  . Alcohol abuse   . Back pain   . Chest pain   . Hypertension    Not on medication as of 03/2014  . Obesity   . Prediabetes   .  Swelling   . Vitamin D deficiency     PAST SURGICAL HISTORY: Past Surgical History:  Procedure Laterality Date  . CESAREAN SECTION     As of 03/2014, x2 (failed induction, then repeat for failed TOLAC)  . CESAREAN SECTION WITH BILATERAL TUBAL LIGATION Bilateral 07/30/2014   Procedure: CESAREAN SECTION WITH BILATERAL TUBAL LIGATION;  Surgeon: Tereso Newcomer, MD;  Location: [facility];  Service: Obstetrics;  Laterality: Bilateral;    SOCIAL HISTORY: Social History   Tobacco Use  . Smoking status: Former  Games developer  . Smokeless tobacco: Never Used  Substance Use Topics  . Alcohol use: Not on file    Comment: 2-3 times weekly  . Drug use: Yes    Types: Marijuana    FAMILY HISTORY: Family History  Problem Relation Age of Onset  . Hypertension Mother   . Anemia Mother   . Obesity Mother   . Hypertension Father   . Alcoholism Father   . Obesity Father   . Cancer Neg Hx   . Diabetes Neg Hx   . Stroke Neg Hx   . Heart disease Neg Hx     ROS: Review of Systems  Constitutional: Negative for weight loss.  Gastrointestinal: Negative for nausea and vomiting.  Musculoskeletal:       Negative for muscle weakness    PHYSICAL EXAM: Pt in no acute distress  RECENT LABS AND TESTS: BMET    Component Value Date/Time   NA 139 12/12/2018 1028   K 4.3 12/12/2018 1028   CL 102 12/12/2018 1028   CO2 22 12/12/2018 1028   GLUCOSE 97 12/12/2018 1028   GLUCOSE 96 12/30/2014 0947   GLUCOSE 89 05/12/2014 1049   BUN 6 12/12/2018 1028   CREATININE 0.53 (L) 12/12/2018 1028   CREATININE 0.43 (L) 12/30/2014 0947   CALCIUM 9.7 12/12/2018 1028   GFRNONAA 123 12/12/2018 1028   GFRAA 142 12/12/2018 1028   Lab Results  Component Value Date   HGBA1C 5.7 (H) 12/12/2018   Lab Results  Component Value Date   INSULIN 24.8 12/12/2018   CBC    Component Value Date/Time   WBC 6.1 12/12/2018 1028   WBC 6.1 12/30/2014 0947   RBC 4.68 12/12/2018 1028   RBC 4.35 12/30/2014 0947   HGB 11.3 12/12/2018 1028   HCT 37.0 12/12/2018 1028   PLT 287 12/30/2014 0947   MCV 79 12/12/2018 1028   MCH 24.1 (L) 12/12/2018 1028   MCH 27.6 12/30/2014 0947   MCHC 30.5 (L) 12/12/2018 1028   MCHC 31.7 12/30/2014 0947   RDW 15.6 (H) 12/12/2018 1028   LYMPHSABS 2.6 12/12/2018 1028   MONOABS 0.3 12/30/2014 0947   EOSABS 0.0 12/12/2018 1028   BASOSABS 0.0 12/12/2018 1028   Iron/TIBC/Ferritin/ %Sat No results found for: IRON, TIBC, FERRITIN, IRONPCTSAT Lipid Panel     Component Value Date/Time   CHOL 200 (H)  12/12/2018 1028   TRIG 155 (H) 12/12/2018 1028   HDL 49 12/12/2018 1028   CHOLHDL 3.5 06/01/2011 0839   VLDL 19 06/01/2011 0839   LDLCALC 120 (H) 12/12/2018 1028   Hepatic Function Panel     Component Value Date/Time   PROT 7.7 12/12/2018 1028   ALBUMIN 4.4 12/12/2018 1028   AST 14 12/12/2018 1028   ALT 13 12/12/2018 1028   ALKPHOS 81 12/12/2018 1028   BILITOT 0.3 12/12/2018 1028   BILIDIR <0.2 07/30/2014 1010   IBILI NOT CALCULATED 07/30/2014 1010      Component Value Date/Time  TSH 0.958 12/12/2018 1028   TSH 0.886 12/30/2014 0947   TSH 1.540 06/01/2011 0839     Ref. Range 12/12/2018 10:28  Vitamin D, 25-Hydroxy Latest Ref Range: 30.0 - 100.0 ng/mL 6.4 (L)     I, [place] Crane, am acting as transcriptionist for [employer], PA-C  Alois Cliche, PA-C have reviewed above note and agree with its content

## 2019-01-30 MED ORDER — VITAMIN D (ERGOCALCIFEROL) 1.25 MG (50000 UNIT) PO CAPS: 50000.0000 [IU] | ORAL_CAPSULE | ORAL | 0 refills | Status: DC

## 2019-02-04 ENCOUNTER — Ambulatory Visit (INDEPENDENT_AMBULATORY_CARE_PROVIDER_SITE_OTHER): Payer: 59 | Admitting: Physician Assistant

## 2019-02-04 ENCOUNTER — Encounter (INDEPENDENT_AMBULATORY_CARE_PROVIDER_SITE_OTHER): Payer: Self-pay

## 2022-07-12 ENCOUNTER — Emergency Department (HOSPITAL_COMMUNITY)
Admission: EM | Admit: 2022-07-12 | Discharge: 2022-07-13 | Disposition: A | Discharge status: Home / Self Care | Payer: BC Managed Care – PPO | Attending: Emergency Medicine | Admitting: Emergency Medicine

## 2022-07-12 ENCOUNTER — Encounter (HOSPITAL_COMMUNITY): Payer: Self-pay | Admitting: Emergency Medicine

## 2022-07-12 ENCOUNTER — Other Ambulatory Visit: Payer: Self-pay

## 2022-07-12 DIAGNOSIS — R112 Nausea with vomiting, unspecified: Secondary | ICD-10-CM | POA: Insufficient documentation

## 2022-07-12 DIAGNOSIS — R42 Dizziness and giddiness: Secondary | ICD-10-CM | POA: Insufficient documentation

## 2022-07-12 DIAGNOSIS — I10 Essential (primary) hypertension: Secondary | ICD-10-CM | POA: Diagnosis not present

## 2022-07-12 NOTE — ED Triage Notes (Signed)
Patient reports dizziness/lightheaded with mild headache and emesis x2 today .

## 2022-07-12 NOTE — ED Provider Triage Note (Addendum)
Emergency Medicine Provider Triage Evaluation Note  Cassandra Chandler , a 41 y.o. female  was evaluated in triage.  Pt complains of dizziness and nausea. Denies headache. Reports vomiting 2x. Having cold sweats. Reports difficulty focusing with eyes. No double vision, loss of vision. Reports good PO intake.   Reports hx of HTN, prediabetes.  Review of Systems  See HPI  Physical Exam  BP (!) 164/92 (BP Location: Right Arm)   Pulse 94   Temp (!) 97.5 F (36.4 C) (Oral)   Resp 18   SpO2 99%  Gen:   Awake, no distress   Resp:  Normal effort  MSK:   Moves extremities without difficulty  Other:  +horizontal nystagmus  Medical Decision Making  Medically screening exam initiated at 11:15 PM.  Appropriate orders placed.  Cassandra Chandler was informed that the remainder of the evaluation will be completed by another provider, this initial triage assessment does not replace that evaluation, and the importance of remaining in the ED until their evaluation is complete.  Ddx: peripheral vs central vertigo, dizziness unspecified  Head CT, CBC, CMP, UPT POC ordered to further evaluate   Cassandra Machorro L, PA 07/12/22 2325    Cassandra Chandler, [place] 07/12/22 2327

## 2022-07-13 ENCOUNTER — Emergency Department (HOSPITAL_COMMUNITY): Payer: BC Managed Care – PPO

## 2022-07-13 LAB — CBC WITH DIFFERENTIAL/PLATELET
Abs Immature Granulocytes: 0.03 10*3/uL (ref 0.00–0.07)
Basophils Absolute: 0 10*3/uL (ref 0.0–0.1)
Basophils Relative: 0 %
Eosinophils Absolute: 0 10*3/uL (ref 0.0–0.5)
Eosinophils Relative: 0 %
HCT: 35 % — ABNORMAL LOW (ref 36.0–46.0)
Hemoglobin: 9.8 g/dL — ABNORMAL LOW (ref 12.0–15.0)
Immature Granulocytes: 0 %
Lymphocytes Relative: 15 %
Lymphs Abs: 1.7 10*3/uL (ref 0.7–4.0)
MCH: 19.6 pg — ABNORMAL LOW (ref 26.0–34.0)
MCHC: 28 g/dL — ABNORMAL LOW (ref 30.0–36.0)
MCV: 69.9 fL — ABNORMAL LOW (ref 80.0–100.0)
Monocytes Absolute: 0.5 10*3/uL (ref 0.1–1.0)
Monocytes Relative: 5 %
Neutro Abs: 8.7 10*3/uL — ABNORMAL HIGH (ref 1.7–7.7)
Neutrophils Relative %: 80 %
Platelets: 468 10*3/uL — ABNORMAL HIGH (ref 150–400)
RBC: 5.01 MIL/uL (ref 3.87–5.11)
RDW: 20.4 % — ABNORMAL HIGH (ref 11.5–15.5)
WBC: 10.9 10*3/uL — ABNORMAL HIGH (ref 4.0–10.5)
nRBC: 0 % (ref 0.0–0.2)

## 2022-07-13 LAB — COMPREHENSIVE METABOLIC PANEL
ALT: 31 U/L (ref 0–44)
AST: 26 U/L (ref 15–41)
Albumin: 4 g/dL (ref 3.5–5.0)
Alkaline Phosphatase: 77 U/L (ref 38–126)
Anion gap: 11 (ref 5–15)
BUN: 6 mg/dL (ref 6–20)
CO2: 24 mmol/L (ref 22–32)
Calcium: 9.6 mg/dL (ref 8.9–10.3)
Chloride: 104 mmol/L (ref 98–111)
Creatinine, Ser: 0.51 mg/dL (ref 0.44–1.00)
GFR, Estimated: >60 mL/min (ref >60)
Glucose, Bld: 176 mg/dL — ABNORMAL HIGH (ref 70–99)
Potassium: 3.5 mmol/L (ref 3.5–5.1)
Sodium: 139 mmol/L (ref 135–145)
Total Bilirubin: 0.5 mg/dL (ref 0.3–1.2)
Total Protein: 8 g/dL (ref 6.5–8.1)

## 2022-07-13 MED ORDER — MECLIZINE HCL 25 MG PO TABS
25.0000 mg | ORAL_TABLET | Freq: Once | ORAL | Status: AC
  Administered 2022-07-13: 25 mg via ORAL
  Filled 2022-07-13: qty 1

## 2022-07-13 MED ORDER — MECLIZINE HCL 25 MG PO TABS: 25.0000 mg | ORAL_TABLET | Freq: Three times a day (TID) | ORAL | 0 refills | Status: DC | PRN

## 2022-07-13 MED ORDER — ONDANSETRON HCL 4 MG PO TABS: 4.0000 mg | ORAL_TABLET | Freq: Four times a day (QID) | ORAL | 0 refills | Status: DC | PRN

## 2022-07-13 MED ORDER — ACETAMINOPHEN 325 MG PO TABS
650.0000 mg | ORAL_TABLET | Freq: Once | ORAL | Status: AC
  Administered 2022-07-13: 650 mg via ORAL
  Filled 2022-07-13: qty 2

## 2022-07-13 MED ORDER — ONDANSETRON HCL 4 MG/2ML IJ SOLN
4.0000 mg | Freq: Once | INTRAMUSCULAR | Status: AC
  Administered 2022-07-13: 4 mg via INTRAVENOUS
  Filled 2022-07-13: qty 2

## 2022-07-13 MED ORDER — SODIUM CHLORIDE 0.9 % IV BOLUS
1000.0000 mL | Freq: Once | INTRAVENOUS | Status: AC
  Administered 2022-07-13: 1000 mL via INTRAVENOUS

## 2022-07-13 NOTE — ED Provider Notes (Signed)
[facility] Provider Note   CSN: [id number] Arrival date & time: 07/12/22  2307     History  Chief Complaint  Patient presents with   Dizzy / Emesis / Headache    [facility] is a 41 y.o. female with medical history of alcohol abuse, chest pain, hypertension, prediabetes.  Patient presents to ED for evaluation of lightheadedness, dizziness, nausea and vomiting.  Patient reports that yesterday around 37 AM she was at home working from home which she typically does during the week.  The patient reports that around this time she became dizzy described as the room spinning.  The patient states that this was worsened when she opens her eyes or stands up, alleviated when she closes her eyes or lays down.  The patient denies any other associated aggravating or alleviating factors.  The patient denies any history of the same, denies history of dizziness or vertigo.  The patient does have a history of low hemoglobin however denies that she ever felt dizzy in the past when her hemoglobin was low.  Patient also complaining of 1 episode of nausea and vomiting along with headache.  Patient denies taking any over-the-counter medication prior to arrival for headache.  Patient denies any fevers, unilateral weakness or numbness, chest pain, shortness of breath.  HPI     Home Medications Prior to Admission medications   Medication Sig Start Date End Date Taking? Authorizing Provider  Multiple Vitamins-Minerals (ONE-A-DAY WOMENS PO) Take by mouth.   Yes [provider]  meclizine (ANTIVERT) 25 MG tablet Take 1 tablet (25 mg total) by mouth 3 (three) times daily as needed for dizziness. 07/13/22  Yes Al Decant, PA-C  ondansetron (ZOFRAN) 4 MG tablet Take 1 tablet (4 mg total) by mouth every 6 (six) hours as needed for nausea or vomiting. 07/13/22  Yes Al Decant, PA-C      Allergies    Patient has no known allergies.    Review of  Systems   Review of Systems  Constitutional:  Negative for fever.  Respiratory:  Negative for shortness of breath.   Cardiovascular:  Negative for chest pain.  Gastrointestinal:  Positive for nausea and vomiting.  Neurological:  Positive for dizziness, light-headedness and headaches. Negative for speech difficulty, weakness and numbness.  All other systems reviewed and are negative.   Physical Exam Updated Vital Signs BP (!) 148/83   Pulse 91   Temp (!) 97.5 F (36.4 C) (Oral)   Resp 16   LMP 06/30/2022   SpO2 100%  Physical Exam Vitals and nursing note reviewed.  Constitutional:      General: She is not in acute distress.    Appearance: Normal appearance. She is not ill-appearing, toxic-appearing or diaphoretic.  HENT:     Head: Normocephalic and atraumatic.     Nose: Nose normal. No congestion.     Mouth/Throat:     Mouth: Mucous membranes are moist.     Pharynx: Oropharynx is clear.  Eyes:     Extraocular Movements: Extraocular movements intact.     Right eye: Nystagmus present.     Left eye: Nystagmus present.     Conjunctiva/sclera: Conjunctivae normal.     Pupils: Pupils are equal, round, and reactive to light.     Comments: Bilateral horizontal nystagmus.  Cardiovascular:     Rate and Rhythm: Normal rate and regular rhythm.  Pulmonary:     Effort: Pulmonary effort is normal.     Breath  sounds: Normal breath sounds. No wheezing.  Abdominal:     General: Abdomen is flat. Bowel sounds are normal.     Palpations: Abdomen is soft.     Tenderness: There is no abdominal tenderness.  Musculoskeletal:     Cervical back: Normal range of motion and neck supple. No tenderness.  Skin:    General: Skin is warm and dry.     Capillary Refill: Capillary refill takes less than 2 seconds.  Neurological:     General: No focal deficit present.     Mental Status: She is alert and oriented to person, place, and time.     GCS: GCS eye subscore is 4. GCS verbal subscore is 5. GCS  motor subscore is 6.     Cranial Nerves: Cranial nerves 2-12 are intact. No cranial nerve deficit.     Sensory: Sensation is intact. No sensory deficit.     Motor: Motor function is intact. No weakness.     Coordination: Coordination is intact. Heel to [facility] Test normal.     ED Results / Procedures / Treatments   Labs (all labs ordered are listed, but only abnormal results are displayed) Labs Reviewed  CBC WITH DIFFERENTIAL/PLATELET - Abnormal; Notable for the following components:      Result Value   WBC 10.9 (*)    Hemoglobin 9.8 (*)    HCT 35.0 (*)    MCV 69.9 (*)    MCH 19.6 (*)    MCHC 28.0 (*)    RDW 20.4 (*)    Platelets 468 (*)    Neutro Abs 8.7 (*)    All other components within normal limits  COMPREHENSIVE METABOLIC PANEL - Abnormal; Notable for the following components:   Glucose, Bld 176 (*)    All other components within normal limits  POC URINE PREG, ED    EKG EKG Interpretation  Date/Time:  Wednesday July 12 2022 23:21:07 EDT Ventricular Rate:  78 PR Interval:  170 QRS Duration: 94 QT Interval:  434 QTC Calculation: 494 R Axis:   66 Text Interpretation: Normal sinus rhythm Nonspecific T wave abnormality Prolonged QT Abnormal ECG No previous ECGs available Confirmed by Zadie Rhine ([id number]) on 07/13/2022 6:06:56 AM  Radiology CT Head Wo Contrast  Result Date: 07/13/2022 CLINICAL DATA:  Dizziness, mild headache and emesis. EXAM: CT HEAD WITHOUT CONTRAST TECHNIQUE: Contiguous axial images were obtained from the base of the skull through the vertex without intravenous contrast. RADIATION DOSE REDUCTION: This exam was performed according to the departmental dose-optimization program which includes automated exposure control, adjustment of the mA and/or kV according to patient size and/or use of iterative reconstruction technique. COMPARISON:  None Available. FINDINGS: Brain: No acute intracranial hemorrhage, midline shift or mass effect. No extra-axial  fluid collection. Gray-white matter differentiation is within normal limits. No hydrocephalus. Vascular: No hyperdense vessel or unexpected calcification. Skull: Normal. Negative for fracture or focal lesion. Sinuses/Orbits: No acute finding. Other: None. IMPRESSION: No acute intracranial process. Electronically Signed   By: Thornell Sartorius M.D.   On: 07/13/2022 01:39    Procedures Procedures   Medications Ordered in ED Medications  sodium chloride 0.9 % bolus 1,000 mL (1,000 mLs Intravenous New Bag/Given 07/13/22 0745)  meclizine (ANTIVERT) tablet 25 mg (25 mg Oral Given 07/13/22 0748)  ondansetron (ZOFRAN) injection 4 mg (4 mg Intravenous Given 07/13/22 0747)  acetaminophen (TYLENOL) tablet 650 mg (650 mg Oral Given 07/13/22 0748)    ED Course/ Medical Decision Making/ A&P  Medical Decision Making  41 year old female presents to ED for evaluation.  Please see HPI for further details.  On examination patient afebrile and nontachycardic.  Patient lung sounds clear bilaterally, not hypoxic.  Patient abdomen soft and compressible throughout.  The patient neurological examination shows no focal neurodeficits.  Patient has bilateral horizontal nystagmus.  Patient work-up initiated in triage includes the following labs and imaging studies: CBC, CMP, urine pregnancy, CT head, EKG  Patient CBC with slight leukocytosis of 10.9 however the patient is afebrile and nontachycardic.  The patient hemoglobin is decreased to 9.8 however the patient denies any blood in stool, blood in vomit, heavy menses.  The patient states she is a history of anemia, is not currently taking her iron pills.  Patient denies any feelings of dizziness in the past her hemoglobin has dropped.  Patient will be advised to continue taking iron at home.  Patient CMP unremarkable.  Patient CT head unremarkable, no acute process identified.  Due to patient dizziness, headache and nausea we will give the patient 650  mg of Tylenol, 25 mg of meclizine, 4 mg of Zofran and 1 L of fluid.  On reassessment the patient states that her dizziness has slightly subsided.  The patient showed the ability to ambulate around the room.  I performed the Epley maneuver on this patient which she states again decreased her dizziness slightly.  I advised the patient that I believe her symptoms are due to vertigo.  I have performed the Epley maneuver on the patient and explained to the patient how to perform the Epley maneuver at home.  I will discharge the patient home with meclizine, antinausea medication.  I have advised the patient that if her dizziness persists after 2 more days to follow-up with neurology.  I have also given the patient return precautions to include one-sided weakness or numbness.  The patient voiced understanding of my instructions.  The patient had all of her questions answered to her satisfaction prior to discharge.  The patient stable at this time for discharge home.  Final Clinical Impression(s) / ED Diagnoses Final diagnoses:  Dizzy    Rx / DC Orders ED Discharge Orders          Ordered    meclizine (ANTIVERT) 25 MG tablet  3 times daily PRN        07/13/22 0856    ondansetron (ZOFRAN) 4 MG tablet  Every 6 hours PRN        07/13/22 0856              Al Decant, PA-C 07/13/22 0902    Mardene Sayer, MD 07/13/22 1939

## 2022-07-13 NOTE — ED Notes (Signed)
DC instructions reviewed with pt. PT verbalized understanding. PT DC °

## 2022-07-13 NOTE — Discharge Instructions (Addendum)
Please return to the ED with any new symptoms such as one-sided weakness or numbness Please follow-up with the neurology team if your dizziness persists after 2 days.  Please call and make an appointment to be seen.  The number and address of the neurology office is on this form. Please call primary care doctor I referred you to make an appointment to establish care.  I have referred you to [facility] health community health and wellness. Please begin taking meclizine 3 times daily as needed for dizziness.  Please also utilize Epley maneuver that we have shown here today.  I have attached a document that instruction on how to do the Epley maneuver. Please read attached guide concerning benign positional vertigo and dizziness Please begin taking oral iron at home

## 2022-07-27 NOTE — Progress Notes (Signed)
[facility]  PATIENT: Cassandra [facility] DOB: 1980/12/24  REFERRING CLINICIAN: No ref. provider found HISTORY FROM: self REASON FOR VISIT: dizziness   HISTORICAL  CHIEF COMPLAINT:  Chief Complaint  Patient presents with   New Patient (Initial Visit)    Pt in room #2 and alone.     HISTORY OF PRESENT ILLNESS:  The patient presents for evaluation of vertigo which began 07/12/22. She was at home on her computer when she looked up and her head "felt fuzzy", but this resolved relatively quickly. 5-10 minutes later this feeling returned. She then started to feel nauseated so she put her head down and rested. She rested for about 20 minutes, then got up to use the restroom, felt nauseated and vomited. She started to feel sweaty and lied down. Felt like the room was spinning when her eyes were closed. Slept for 1-2 hours. When she woke up felt like her eyes were darting around. No headaches, photophobia, or phonophobia. Vertigo lasted for the rest of the night. She denies positional triggers.  She presented to the ED 07/13/22 where [facility] was unremarkable.  Epley maneuver was done which did help reduce her symptoms. She also took meclizine which helped. Vertigo lasted 3-4 days after she was discharged. She has been completely vertigo free since the 19th.  Has a history of mild headaches, never been diagnosed with migraines.  OTHER MEDICAL CONDITIONS: prediabetes, iron deficiency anemia   REVIEW OF SYSTEMS: Full 14 system review of systems performed and negative with exception of: vertigo, nausea  ALLERGIES: No Known Allergies  HOME MEDICATIONS: Outpatient Medications Prior to Visit  Medication Sig Dispense Refill   meclizine (ANTIVERT) 25 MG tablet Take 1 tablet (25 mg total) by mouth 3 (three) times daily as needed for dizziness. 30 tablet 0   Multiple Vitamins-Minerals (ONE-A-DAY WOMENS PO) Take by mouth.     ondansetron (ZOFRAN) 4 MG tablet Take 1 tablet (4 mg total)  by mouth every 6 (six) hours as needed for nausea or vomiting. 15 tablet 0   No facility-administered medications prior to visit.    PAST MEDICAL HISTORY: Past Medical History:  Diagnosis Date   Alcohol abuse    Back pain    Chest pain    Hypertension    Not on medication as of 03/2014   Obesity    Prediabetes    Swelling    Vitamin D deficiency     PAST SURGICAL HISTORY: Past Surgical History:  Procedure Laterality Date   CESAREAN SECTION     As of 03/2014, x2 (failed induction, then repeat for failed TOLAC)   CESAREAN SECTION WITH BILATERAL TUBAL LIGATION Bilateral 07/30/2014   Procedure: CESAREAN SECTION WITH BILATERAL TUBAL LIGATION;  Surgeon: Tereso Newcomer, MD;  Location: [facility];  Service: Obstetrics;  Laterality: Bilateral;    FAMILY HISTORY: Family History  Problem Relation Age of Onset   Hypertension Mother    Anemia Mother    Obesity Mother    Hypertension Father    Alcoholism Father    Obesity Father    Cancer Neg Hx    Diabetes Neg Hx    Stroke Neg Hx    Heart disease Neg Hx     SOCIAL HISTORY: Social History   Socioeconomic History   Marital status: Significant Other    Spouse name: Not on file   Number of children: 3   Years of education: Not on file   Highest education level: Not on file  Occupational History  Not on file  Tobacco Use   Smoking status: Former   Smokeless tobacco: Never  Vaping Use   Vaping Use: Never used  Substance and Sexual Activity   Alcohol use: Not on file    Comment: 2-3 times weekly   Drug use: Yes    Types: Marijuana   Sexual activity: Yes    Birth control/protection: I.U.D.  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  [employer].  Graduated from college.  Longer tem relationship - 7 years, father of children.   Social Determinants of Health   Financial Resource Strain: Not  on file  Food Insecurity: Not on file  Transportation Needs: Not on file  Physical Activity: Not on file  Stress: Not on file  Social Connections: Not on file  Intimate Partner Violence: Not on file     PHYSICAL EXAM  GENERAL EXAM/CONSTITUTIONAL: Vitals:  Vitals:   07/28/22 1058  BP: (!) 152/90  Pulse: 92  Weight: 249 lb 3.2 oz (113 kg)  Height: 5\' 4"  (1.626 m)   Body mass index is 42.78 kg/m. Wt Readings from Last 3 Encounters:  07/28/22 249 lb 3.2 oz (113 kg)  01/01/19 233 lb (105.7 kg)  12/12/18 239 lb (108.4 kg)   NEUROLOGIC: MENTAL STATUS:  awake, alert, oriented to person, place and time recent and remote memory intact normal attention and concentration  CRANIAL NERVE:  2nd, 3rd, 4th, 6th - pupils equal and reactive to light, visual fields full to confrontation, extraocular muscles intact, no nystagmus 5th - facial sensation symmetric 7th - facial strength symmetric 8th - hearing intact 9th - palate elevates symmetrically, uvula midline 11th - shoulder shrug symmetric 12th - tongue protrusion midline  MOTOR:  normal bulk and tone, full strength in the BUE, BLE  SENSORY:  normal and symmetric to light touch all 4 extremities  COORDINATION:  finger-nose-finger, heel-to-shin intact bilaterally  REFLEXES:  deep tendon reflexes present and symmetric  GAIT/STATION:  normal     DIAGNOSTIC DATA (LABS, IMAGING, TESTING) - I reviewed patient records, labs, notes, testing and imaging myself where available.  Lab Results  Component Value Date   WBC 10.9 (H) 07/12/2022   HGB 9.8 (L) 07/12/2022   HCT 35.0 (L) 07/12/2022   MCV 69.9 (L) 07/12/2022   PLT 468 (H) 07/12/2022      Component Value Date/Time   NA 139 07/12/2022 2327   NA 139 12/12/2018 1028   K 3.5 07/12/2022 2327   CL 104 07/12/2022 2327   CO2 24 07/12/2022 2327   GLUCOSE 176 (H) 07/12/2022 2327   GLUCOSE 89 05/12/2014 1049   BUN 6 07/12/2022 2327   BUN 6 12/12/2018 1028    CREATININE 0.51 07/12/2022 2327   CREATININE 0.43 (L) 12/30/2014 0947   CALCIUM 9.6 07/12/2022 2327   PROT 8.0 07/12/2022 2327   PROT 7.7 12/12/2018 1028   ALBUMIN 4.0 07/12/2022 2327   ALBUMIN 4.4 12/12/2018 1028   AST 26 07/12/2022 2327   ALT 31 07/12/2022 2327   ALKPHOS 77 07/12/2022 2327   BILITOT 0.5 07/12/2022 2327   BILITOT 0.3 12/12/2018 1028   GFRNONAA >60 07/12/2022 2327   GFRAA 142 12/12/2018 1028   Lab Results  Component Value Date   CHOL 200 (H) 12/12/2018   HDL 49 12/12/2018   LDLCALC 120 (H) 12/12/2018   TRIG 155 (  H) 12/12/2018   CHOLHDL 3.5 06/01/2011   Lab Results  Component Value Date   HGBA1C 5.7 (H) 12/12/2018   Lab Results  Component Value Date   JQBHALPF79 024 12/12/2018   Lab Results  Component Value Date   TSH 0.958 12/12/2018      ASSESSMENT AND PLAN  41 y.o. year old female with a history of prediabetes, iron deficiency anemia who presents for evaluation of vertigo. CTH was normal, images reviewed personally today. Constant vertigo with severe onset and gradual improvement  is most consistent with vestibular neuritis. She is currently symptom free. Will have her return to clinic if vertigo returns. If she does have a recurrence of vertigo may consider vestibular therapy.   1. Encounter to establish care       PLAN: -Consider vestibular therapy if vertigo returns -Referral to primary care placed at patient's request  Orders Placed This Encounter  Procedures   Ambulatory Referral to Primary Care    Return if symptoms worsen or fail to improve.   Genia Harold, MD 07/28/22 11:40 AM  I spent an average of 24 minutes chart reviewing and counseling the patient, with at least 50% of the time face to face with the patient.   [facility] [address], [place], [place] [address] [phone]

## 2022-07-28 ENCOUNTER — Encounter: Payer: Self-pay | Admitting: Psychiatry

## 2022-07-28 ENCOUNTER — Ambulatory Visit (INDEPENDENT_AMBULATORY_CARE_PROVIDER_SITE_OTHER): Payer: BC Managed Care – PPO | Admitting: Psychiatry

## 2022-07-28 VITALS — BP 152/90 | HR 92 | Ht 64.0 in | Wt 249.2 lb

## 2022-07-28 DIAGNOSIS — Z7689 Persons encountering health services in other specified circumstances: Secondary | ICD-10-CM | POA: Diagnosis not present

## 2022-07-28 NOTE — Patient Instructions (Addendum)
Vestibular Neuritis is a condition that causes sudden dizziness and balance problems. People with this problem also feel nauseous and might vomit. Often, people feel very ill for a few days and then start to feel better. But sometimes, the symptoms last for weeks. In some people, vestibular neuritis happens during or after an infection from a virus. But most people do not have symptoms of an infection.   What are the symptoms? The symptoms include: ?Feeling like you are spinning, swaying, or tilting - You might also feel like the room is moving around you. This is a type of dizziness called "vertigo." ?Nausea ?Vomiting ?Problems with balancing and walking  Vestibular neuritis can make you feel very sick. But it is not dangerous. The symptoms typically improve after a few days, even without treatment. Most people feel completely better within a few weeks. Treatment can help relieve your symptoms while you recover. It might also help you recover faster. The treatment depends on your symptoms, how long they have lasted, and what likely caused it.  Your doctor might prescribe medicines that can: ?Treat symptoms of vertigo ?Treat nausea and vomiting While you are still having symptoms, drink plenty of fluids. Also, try to avoid falls. Move slowly, and ask for help if you feel off balance. Do not drive if you feel dizzy. If your symptoms last more than a few weeks, your doctor might refer you to a physical therapist (exercise expert). They can teach you special exercises to help with your balance.

## 2022-07-31 ENCOUNTER — Telehealth: Payer: Self-pay | Admitting: Psychiatry

## 2022-07-31 NOTE — Telephone Encounter (Signed)
Referral for primary care sent to [facility]. Phone: [phone], [phone]

## 2023-08-27 ENCOUNTER — Encounter: Payer: Self-pay | Admitting: Family Medicine

## 2023-08-27 ENCOUNTER — Ambulatory Visit (INDEPENDENT_AMBULATORY_CARE_PROVIDER_SITE_OTHER): Payer: No Typology Code available for payment source | Admitting: Family Medicine

## 2023-08-27 VITALS — BP 132/88 | HR 88 | Temp 98.0°F | Ht 65.5 in | Wt 255.6 lb

## 2023-08-27 DIAGNOSIS — I10 Essential (primary) hypertension: Secondary | ICD-10-CM | POA: Diagnosis not present

## 2023-08-27 DIAGNOSIS — E559 Vitamin D deficiency, unspecified: Secondary | ICD-10-CM | POA: Diagnosis not present

## 2023-08-27 DIAGNOSIS — E66813 Obesity, class 3: Secondary | ICD-10-CM

## 2023-08-27 DIAGNOSIS — Z7689 Persons encountering health services in other specified circumstances: Secondary | ICD-10-CM

## 2023-08-27 DIAGNOSIS — R7303 Prediabetes: Secondary | ICD-10-CM

## 2023-08-27 DIAGNOSIS — Z124 Encounter for screening for malignant neoplasm of cervix: Secondary | ICD-10-CM

## 2023-08-27 DIAGNOSIS — Z1159 Encounter for screening for other viral diseases: Secondary | ICD-10-CM

## 2023-08-27 DIAGNOSIS — Z6841 Body Mass Index (BMI) 40.0 and over, adult: Secondary | ICD-10-CM | POA: Diagnosis not present

## 2023-08-27 DIAGNOSIS — Z1231 Encounter for screening mammogram for malignant neoplasm of breast: Secondary | ICD-10-CM

## 2023-08-27 LAB — CBC WITH DIFFERENTIAL/PLATELET
Basophils Absolute: 0 10*3/uL (ref 0.0–0.1)
Basophils Relative: 0.3 % (ref 0.0–3.0)
Eosinophils Absolute: 0.1 10*3/uL (ref 0.0–0.7)
Eosinophils Relative: 1.4 % (ref 0.0–5.0)
HCT: 32.4 % — ABNORMAL LOW (ref 36.0–46.0)
Hemoglobin: 9.7 g/dL — ABNORMAL LOW (ref 12.0–15.0)
Lymphocytes Relative: 39.7 % (ref 12.0–46.0)
Lymphs Abs: 2.6 10*3/uL (ref 0.7–4.0)
MCHC: 29.9 g/dL — ABNORMAL LOW (ref 30.0–36.0)
MCV: 71.6 fL — ABNORMAL LOW (ref 78.0–100.0)
Monocytes Absolute: 0.4 10*3/uL (ref 0.1–1.0)
Monocytes Relative: 5.9 % (ref 3.0–12.0)
Neutro Abs: 3.5 10*3/uL (ref 1.4–7.7)
Neutrophils Relative %: 52.7 % (ref 43.0–77.0)
Platelets: 365 10*3/uL (ref 150.0–400.0)
RBC: 4.53 Mil/uL (ref 3.87–5.11)
RDW: 18.9 % — ABNORMAL HIGH (ref 11.5–15.5)
WBC: 6.7 10*3/uL (ref 4.0–10.5)

## 2023-08-27 LAB — COMPREHENSIVE METABOLIC PANEL
ALT: 29 U/L (ref 0–35)
AST: 21 U/L (ref 0–37)
Albumin: 4.3 g/dL (ref 3.5–5.2)
Alkaline Phosphatase: 89 U/L (ref 39–117)
BUN: 7 mg/dL (ref 6–23)
CO2: 28 meq/L (ref 19–32)
Calcium: 9.4 mg/dL (ref 8.4–10.5)
Chloride: 100 meq/L (ref 96–112)
Creatinine, Ser: 0.5 mg/dL (ref 0.40–1.20)
GFR: 117.54 mL/min (ref >60.00)
Glucose, Bld: 98 mg/dL (ref 70–99)
Potassium: 3.5 meq/L (ref 3.5–5.1)
Sodium: 136 meq/L (ref 135–145)
Total Bilirubin: 0.4 mg/dL (ref 0.2–1.2)
Total Protein: 7.9 g/dL (ref 6.0–8.3)

## 2023-08-27 LAB — LIPID PANEL
Cholesterol: 200 mg/dL (ref 0–200)
HDL: 46.6 mg/dL (ref >39.00)
LDL Cholesterol: 125 mg/dL — ABNORMAL HIGH (ref 0–99)
NonHDL: 153.75
Total CHOL/HDL Ratio: 4
Triglycerides: 143 mg/dL (ref 0.0–149.0)
VLDL: 28.6 mg/dL (ref 0.0–40.0)

## 2023-08-27 LAB — HEMOGLOBIN A1C: Hgb A1c MFr Bld: 6.1 % (ref 4.6–6.5)

## 2023-08-27 LAB — TSH: TSH: 1.76 u[IU]/mL (ref 0.35–5.50)

## 2023-08-27 LAB — VITAMIN D 25 HYDROXY (VIT D DEFICIENCY, FRACTURES): VITD: 7.43 ng/mL — ABNORMAL LOW (ref 30.00–100.00)

## 2023-08-27 NOTE — Progress Notes (Signed)
New Patient Office Visit  Subjective    Patient ID: SADHIKA BLEW, female    DOB: May 28, 1981  Age: 42 y.o. MRN: [id number]  CC:  Chief Complaint  Patient presents with   Establish Care    Wt loss options, dietary suggestions     HPI Jaclin Denenberg Pedraza presents to establish care with new provider.   Patients previous primary care provider: [facility] with Dr. Denny Levy. Last seen 12/30/2014.   Specialist: None   HTN: Chronic. Patients blood pressure is elevated today. She reports she has previously took medication for HTN. She reports it has been years and does not remember what the name of the medication was. Based on medication history, she was prescribed Chlorthalidone back in 2020. Does not monitor her blood pressure on a regularly. Reports chest pain only with exertion of walking steps sometimes. Denies SHOB, dizziness, lightheadedness, HA, and lower extremity edema.   Outpatient Encounter Medications as of 08/27/2023  Medication Sig   [DISCONTINUED] meclizine (ANTIVERT) 25 MG tablet Take 1 tablet (25 mg total) by mouth 3 (three) times daily as needed for dizziness. (Patient not taking: Reported on 08/27/2023)   [DISCONTINUED] Multiple Vitamins-Minerals (ONE-A-DAY WOMENS PO) Take by mouth. (Patient not taking: Reported on 08/27/2023)   [DISCONTINUED] ondansetron (ZOFRAN) 4 MG tablet Take 1 tablet (4 mg total) by mouth every 6 (six) hours as needed for nausea or vomiting. (Patient not taking: Reported on 08/27/2023)   No facility-administered encounter medications on file as of 08/27/2023.    Past Medical History:  Diagnosis Date   Alcohol abuse    Back pain    Chest pain    Hyperlipidemia    Hypertension    Not on medication as of 03/2014   Obesity    Prediabetes    Swelling    Vitamin D deficiency     Past Surgical History:  Procedure Laterality Date   CESAREAN SECTION     As of 03/2014, x2 (failed induction, then repeat for failed TOLAC)    CESAREAN SECTION WITH BILATERAL TUBAL LIGATION Bilateral 07/30/2014   Procedure: CESAREAN SECTION WITH BILATERAL TUBAL LIGATION;  Surgeon: Tereso Newcomer, MD;  Location: [facility];  Service: Obstetrics;  Laterality: Bilateral;   TUBAL LIGATION      Family History  Problem Relation Age of Onset   Hypertension Mother    Anemia Mother    Obesity Mother    Hypertension Father    Alcoholism Father    Obesity Father    Hypertension Maternal Grandfather    Cancer Neg Hx    Diabetes Neg Hx    Stroke Neg Hx    Heart disease Neg Hx     Social History   Socioeconomic History   Marital status: Significant Other    Spouse name: Not on file   Number of children: 3   Years of education: Not on file   Highest education level: Bachelor's degree (e.g., BA, AB, BS)  Occupational History   Not on file  Tobacco Use   Smoking status: Former   Smokeless tobacco: Never  Vaping Use   Vaping status: Never Used  Substance and Sexual Activity   Alcohol use: Yes    Alcohol/week: 4.0 standard drinks of alcohol    Types: 4 Cans of beer per week    Comment: 2-3 times weekly   Drug use: Yes    Types: Marijuana    Comment: Once a month   Sexual activity: Yes  Other Topics Concern   Not on file  Social History Narrative   Lives with two kids, ages 2 & 4 Leanne Chang Sydell Axon) in 2012) 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  Graduated from college.  Longer tem relationship - 7 years, father of children.   Social Determinants of Health   Financial Resource Strain: Low Risk  (08/27/2023)   Overall Financial Resource Strain (CARDIA)    Difficulty of Paying Living Expenses: Not very hard  Food Insecurity: No Food Insecurity (08/27/2023)   Hunger Vital Sign    Worried About Running Out of Food in the Last Year: Never true    Ran Out of Food in the Last Year: Never true  Transportation Needs: No Transportation Needs (08/27/2023)   PRAPARE - Scientist, research (physical sciences) (Medical): No    Lack of Transportation (Non-Medical): No  Physical Activity: Inactive (08/27/2023)   Exercise Vital Sign    Days of Exercise per Week: 0 days    Minutes of Exercise per Session: 0 min  Stress: No Stress Concern Present (08/27/2023)   [employer] of Occupational Health - Occupational Stress Questionnaire    Feeling of Stress : Not at all  Social Connections: Moderately Integrated (08/27/2023)   Social Connection and Isolation Panel [NHANES]    Frequency of Communication with Friends and Family: Twice a week    Frequency of Social Gatherings with Friends and Family: Once a week    Attends Religious Services: 1 to 4 times per year    Active Member of [employer] or Organizations: No    Attends Banker Meetings: Never    Marital Status: Living with partner  Intimate Partner Violence: Not At Risk (08/27/2023)   Humiliation, Afraid, Rape, and Kick questionnaire    Fear of Current or Ex-Partner: No    Emotionally Abused: No    Physically Abused: No    Sexually Abused: No    ROS See HPI above    Objective   BP 132/88 (BP Location: Right Arm, Patient Position: Sitting, Cuff Size: Large)   Pulse 88   Temp 98 F (36.7 C) (Oral)   Ht 5' 5.5" (1.664 m)   Wt 255 lb 9.6 oz (115.9 kg)   LMP 07/30/2023 (Approximate)   SpO2 96%   BMI 41.89 kg/m   Physical Exam Vitals reviewed.  Constitutional:      General: She is not in acute distress.    Appearance: Normal appearance. She is obese. She is not ill-appearing, toxic-appearing or diaphoretic.  HENT:     Head: Normocephalic and atraumatic.  Eyes:     General:        Right eye: No discharge.        Left eye: No discharge.     Conjunctiva/sclera: Conjunctivae normal.  Cardiovascular:     Rate and Rhythm: Normal rate and regular rhythm.     Heart sounds: Normal heart sounds. No murmur heard.    No friction rub. No gallop.  Pulmonary:     Effort: Pulmonary effort is normal. No respiratory  distress.     Breath sounds: Normal breath sounds.  Musculoskeletal:        General: Normal range of motion.  Skin:    General: Skin is warm and dry.  Neurological:     General: No focal deficit present.     Mental Status: She is alert and oriented to person, place, and time. Mental status is at baseline.  Psychiatric:  Mood and Affect: Mood normal.        Behavior: Behavior normal.        Thought Content: Thought content normal.        Judgment: Judgment normal.      Assessment & Plan:  Hypertension, unspecified type -     Comprehensive metabolic panel  Vitamin D deficiency -     VITAMIN D 25 Hydroxy (Vit-D Deficiency, Fractures)  Prediabetes -     TSH  Need for hepatitis C screening test -     Hepatitis C antibody  Class 3 severe obesity due to excess calories without serious comorbidity with body mass index (BMI) of 40.0 to 44.9 in adult (HCC) -     CBC with Differential/Platelet -     Comprehensive metabolic panel -     Hemoglobin A1c -     Lipid panel -     TSH  Cervical cancer screening -     Ambulatory referral to Gynecology  Encounter for screening mammogram for malignant neoplasm of breast -     3D Screening Mammogram, Left and Right; Future  Encounter to establish care   1.Review health maintenance:  -[facility]; Placed a referral back to Dr. Cherly Hensen  -Covid booster: Declines  -Influenza vaccine: Declines  -Hep C: Ordered  -Mammogram: Ordered  2.Blood pressure is elevated today. Recommend to obtain an upper blood pressure cuff machine. Take blood pressure twice a day, once in the AM and once in the PM. Record readings and bring to her next appointment. If blood pressure is consistently elevated, 140/90, will need to discuss about blood pressure medication.  3.Ordered labs based on previous medical history and BMI.  Return in about 2 weeks (around 09/10/2023) for follow-up.   Zandra Abts,  NP

## 2023-08-27 NOTE — Patient Instructions (Addendum)
-  It was a pleasure to meet you and look forward to taking care of you. -Blood pressure is elevated today. Recommend to obtain an upper blood pressure cuff machine. Take your blood pressure twice a day, once in the AM and once in the PM. Record readings and bring to your next appointment. If blood pressure is consistently elevated, 140/90, will need to discuss about blood pressure medication.  -Ordered labs. Office will call with lab results and you will see results on MyChart.  -Placed a referral to GYN for pap smear.  -Ordered mammogram.  -Call the office or send a MyChart message if you do not hear back about an appointment or receive a MyChart message.  -Follow up in 2 weeks.

## 2023-08-28 ENCOUNTER — Other Ambulatory Visit: Payer: Self-pay

## 2023-08-28 DIAGNOSIS — E559 Vitamin D deficiency, unspecified: Secondary | ICD-10-CM

## 2023-08-28 DIAGNOSIS — D649 Anemia, unspecified: Secondary | ICD-10-CM

## 2023-08-28 LAB — HEPATITIS C ANTIBODY: Hepatitis C Ab: NONREACTIVE

## 2023-08-28 MED ORDER — VITAMIN D (ERGOCALCIFEROL) 1.25 MG (50000 UNIT) PO CAPS: 50000.0000 [IU] | ORAL_CAPSULE | ORAL | 0 refills | Status: AC

## 2023-09-07 NOTE — Progress Notes (Unsigned)
   Established Patient Office Visit   Subjective:  Patient ID: Cassandra Chandler, female    DOB: 01-Jan-1981  Age: 42 y.o. MRN: [id number]  No chief complaint on file.   HPI Patients blood pressure was elevated at last visit. She is following up on her blood pressure. She was recommend to monitor her blood pressure BID and bring readings to her appointment. She reported on her previous visit, she had took medication in the past, but uncertain which medication.   On previous labs, A1c was elevated, 6.1.   Will need iron panel drawn today due low hemoglobin and hematocrit on previous CBC.   ROS See HPI above     Objective:     LMP 07/30/2023 (Approximate)  {Vitals History (Optional):23777}  Physical Exam  No results found for any visits on 09/10/23.  The 10-year ASCVD risk score (Arnett DK, et al., 2019) is: 1%    Assessment & Plan:  There are no diagnoses linked to this encounter.  No follow-ups on file.   Zandra Abts, NP

## 2023-09-10 ENCOUNTER — Other Ambulatory Visit: Payer: No Typology Code available for payment source

## 2023-09-10 ENCOUNTER — Ambulatory Visit (INDEPENDENT_AMBULATORY_CARE_PROVIDER_SITE_OTHER): Payer: No Typology Code available for payment source | Admitting: Family Medicine

## 2023-09-10 VITALS — BP 148/110 | HR 79 | Temp 98.4°F | Ht 65.5 in | Wt 250.6 lb

## 2023-09-10 DIAGNOSIS — I10 Essential (primary) hypertension: Secondary | ICD-10-CM | POA: Diagnosis not present

## 2023-09-10 DIAGNOSIS — R7303 Prediabetes: Secondary | ICD-10-CM | POA: Insufficient documentation

## 2023-09-10 DIAGNOSIS — D649 Anemia, unspecified: Secondary | ICD-10-CM | POA: Diagnosis not present

## 2023-09-10 MED ORDER — AMLODIPINE BESYLATE 5 MG PO TABS: 5.0000 mg | ORAL_TABLET | Freq: Every day | ORAL | 0 refills | Status: DC

## 2023-09-10 MED ORDER — METFORMIN HCL ER 500 MG PO TB24: 500.0000 mg | ORAL_TABLET | Freq: Every day | ORAL | 0 refills | Status: AC

## 2023-09-10 NOTE — Assessment & Plan Note (Signed)
Discussed about prediabetes. Through shared decision making, decided to start Metformin  XR 500mg  tablet, take 1 tablet once a day. Recommend to initially start taking medication at bedtime. Provided information about prediabetes and eating plan. She reports she has previous tried to work on diet and exercise for prediabetes and unable to control.

## 2023-09-10 NOTE — Assessment & Plan Note (Signed)
Blood pressure is elevated. Uncontrolled. Prescribed Amlodipine 5mg  tablet, 1 tablet daily. Recommend to monitor her blood pressure twice a day and have readings available for next visit in 2 weeks, either face to face to telehealth. Discussed about taking Amlodipine and low sodium diet. Provided material about low sodium diet (DASH) and managing hypertension.

## 2023-09-10 NOTE — Patient Instructions (Addendum)
-  Please call to schedule an appointment for pap smear with Dr. Nelta Numbers. If you have any complications with making appointment, please call back to the office or send a MyChart message.  Dr. Nena Jordan A. Cousins [address], [place], [place] [address]  [phone] -Blood pressure is elevated. Recommend to START Amlodipine 5mg  tablet, 1 tablet daily. Recommend to monitor your blood pressure twice a day and have readings available for next visit in 2 weeks, either face to face to telehealth. Discussed about taking Amlodipine and low sodium diet. Provided material about low sodium diet (DASH) and managing hypertension.  -Discussed about prediabetes. Through shared decision making, decided to START Metformin  XR 500mg  tablet, take 1 tablet once a day. Recommend to initially start taking medication at bedtime. Provided information about prediabetes and eating plan.  -Ordered iron panel for a low hemoglobin and hematocrit on previous labs. Office will call with results and you will see them on MyChart.  -Follow up in 2 weeks.

## 2023-09-11 LAB — IRON,TIBC AND FERRITIN PANEL
%SAT: 4 % — ABNORMAL LOW (ref 16–45)
Ferritin: 5 ng/mL — ABNORMAL LOW (ref 16–154)
Iron: 23 ug/dL — ABNORMAL LOW (ref 40–190)
TIBC: 575 ug/dL — ABNORMAL HIGH (ref 250–450)

## 2023-09-24 ENCOUNTER — Telehealth (INDEPENDENT_AMBULATORY_CARE_PROVIDER_SITE_OTHER): Payer: No Typology Code available for payment source | Admitting: Family Medicine

## 2023-09-24 ENCOUNTER — Encounter: Payer: Self-pay | Admitting: Family Medicine

## 2023-09-24 ENCOUNTER — Ambulatory Visit
Admission: RE | Admit: 2023-09-24 | Discharge: 2023-09-24 | Disposition: A | Discharge status: Home / Self Care | Payer: No Typology Code available for payment source | Source: Ambulatory Visit | Attending: Family Medicine

## 2023-09-24 VITALS — BP 167/101 | HR 86

## 2023-09-24 DIAGNOSIS — Z1231 Encounter for screening mammogram for malignant neoplasm of breast: Secondary | ICD-10-CM

## 2023-09-24 DIAGNOSIS — I10 Essential (primary) hypertension: Secondary | ICD-10-CM

## 2023-09-24 DIAGNOSIS — R7303 Prediabetes: Secondary | ICD-10-CM

## 2023-09-24 MED ORDER — AMLODIPINE BESYLATE 10 MG PO TABS: 10.0000 mg | ORAL_TABLET | Freq: Every day | ORAL | Status: AC

## 2023-09-24 NOTE — Patient Instructions (Addendum)
-  Blood pressure is still elevated. Recommend to increase Amlodipine to 10mg  daily from 5mg  daily. Recommend to continue to monitor your blood pressure twice a day and provide readings at next appointment, either face to face or telehealth. Recommend to take 2 tablets of Amlodipine 5mg  to equal 10mg  total.  -May change to taking Metformin in the morning since you are not having any side effects.  -Continue to walk 150 minutes a week, a 30 minute walk daily or every other day, at least every 3rd day.  -Follow up 2 weeks either face to face or telehealth on blood pressure. Also, make a lab visit in 2 months to have your vitamin D rechecked.

## 2023-09-24 NOTE — Progress Notes (Signed)
Virtual Visit via Video Note  I connected with Cassandra Chandler on 09/24/23 at 1:11pm by a video enabled telemedicine application and verified that I am speaking with the correct person using two identifiers.   Patient Location: Home Provider Location: office - [facility].    I discussed the limitations, risks, security and privacy concerns of performing an evaluation and management service by telephone and the availability of in person appointments. I also discussed with the patient that there may be a patient responsible charge related to this service. The patient expressed understanding and agreed to proceed, consent obtained  Chief Complaint  Patient presents with   Medical Management of Chronic Issues    Follow up on BP. Today BP 167/101 P 86 . took Amlodipine today.     History of Present Illness: Cassandra Chandler is a 42 y.o. female that is following up for hypertension and prediabetes. She has been taking Amlodipine 5mg  daily and Metformin 500mg  daily. She reports she has been monitoring her blood pressure. Ranging 144-184/88-106. Denies CP, SHOB, HA, dizziness, lightheadedness, or lower extremity edema. She reports she has been feeling less short of breath when walking further distance, like to the mail box and back.   BP Readings from Last 3 Encounters:  09/24/23 (!) 167/101  09/10/23 (!) 148/110  08/27/23 132/88     Patient Active Problem List   Diagnosis Date Noted   Prediabetes 09/10/2023   Hypertension 03/31/2014   History of abnormal cervical Pap smear 12/25/2007   OBESITY, CLASS III 11/26/2007   Past Medical History:  Diagnosis Date   Alcohol abuse    Back pain    Chest pain    Hyperlipidemia    Hypertension    Not on medication as of 03/2014   Obesity    Prediabetes    Swelling    Vitamin D deficiency    Past Surgical History:  Procedure Laterality Date   CESAREAN SECTION     As of 03/2014, x2 (failed induction, then repeat for failed TOLAC)    CESAREAN SECTION WITH BILATERAL TUBAL LIGATION Bilateral 07/30/2014   Procedure: CESAREAN SECTION WITH BILATERAL TUBAL LIGATION;  Surgeon: Tereso Newcomer, MD;  Location: [facility];  Service: Obstetrics;  Laterality: Bilateral;   TUBAL LIGATION     No Known Allergies Prior to Admission medications   Medication Sig Start Date End Date Taking? Authorizing Provider  amLODipine (NORVASC) 5 MG tablet Take 1 tablet (5 mg total) by mouth daily. 09/10/23 12/09/23 Yes Alveria Apley, NP  metFORMIN (GLUCOPHAGE-XR) 500 MG 24 hr tablet Take 1 tablet (500 mg total) by mouth daily with breakfast. 09/10/23 12/09/23 Yes Alveria Apley, NP  Multiple Vitamin (MULTI VITAMIN PO) Take by mouth daily. Women One A Day.   Yes [provider]  Vitamin D, Ergocalciferol, (DRISDOL) 1.25 MG (50000 UNIT) CAPS capsule Take 1 capsule (50,000 Units total) by mouth every 7 (seven) days. 08/28/23  Yes Alveria Apley, NP   Social History   Socioeconomic History   Marital status: Significant Other    Spouse name: Not on file   Number of children: 3   Years of education: Not on file   Highest education level: Bachelor's degree (e.g., BA, AB, BS)  Occupational History   Not on file  Tobacco Use   Smoking status: Former   Smokeless tobacco: Never  Vaping Use   Vaping status: Never Used  Substance and Sexual Activity   Alcohol use: Yes    Alcohol/week:  4.0 standard drinks of alcohol    Types: 4 Cans of beer per week    Comment: 2-3 times weekly   Drug use: Yes    Types: Marijuana    Comment: Once a month   Sexual activity: Yes  Other Topics Concern   Not on file  Social History Narrative   Lives with two kids, ages 2 & 4 Leanne Chang Sydell Axon) in 2012) 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  Graduated from college.  Longer tem relationship - 7 years, father of children.   Social Determinants of Health   Financial Resource Strain: Low Risk  (08/27/2023)   Overall  Financial Resource Strain (CARDIA)    Difficulty of Paying Living Expenses: Not very hard  Food Insecurity: No Food Insecurity (08/27/2023)   Hunger Vital Sign    Worried About Running Out of Food in the Last Year: Never true    Ran Out of Food in the Last Year: Never true  Transportation Needs: No Transportation Needs (08/27/2023)   PRAPARE - Administrator, Civil Service (Medical): No    Lack of Transportation (Non-Medical): No  Physical Activity: Inactive (08/27/2023)   Exercise Vital Sign    Days of Exercise per Week: 0 days    Minutes of Exercise per Session: 0 min  Stress: No Stress Concern Present (08/27/2023)   [employer] of Occupational Health - Occupational Stress Questionnaire    Feeling of Stress : Not at all  Social Connections: Moderately Integrated (08/27/2023)   Social Connection and Isolation Panel [NHANES]    Frequency of Communication with Friends and Family: Twice a week    Frequency of Social Gatherings with Friends and Family: Once a week    Attends Religious Services: 1 to 4 times per year    Active Member of [employer] or Organizations: No    Attends Banker Meetings: Never    Marital Status: Living with partner  Intimate Partner Violence: Not At Risk (08/27/2023)   Humiliation, Afraid, Rape, and Kick questionnaire    Fear of Current or Ex-Partner: No    Emotionally Abused: No    Physically Abused: No    Sexually Abused: No    Observations/Objective: Today's Vitals   09/24/23 1302  BP: (!) 167/101  Pulse: 86  Patient is unable to obtain a full set of vital signs since she does not have access to all the equipment.  Physical Exam Constitutional:      General: She is not in acute distress.    Appearance: Normal appearance. She is not ill-appearing, toxic-appearing or diaphoretic.  HENT:     Head: Normocephalic and atraumatic.  Eyes:     General:        Right eye: No discharge.        Left eye: No discharge.      Conjunctiva/sclera: Conjunctivae normal.  Cardiovascular:     Rate and Rhythm: Normal rate.  Pulmonary:     Effort: Pulmonary effort is normal. No respiratory distress.  Skin:    General: Skin is dry.  Neurological:     General: No focal deficit present.     Mental Status: She is alert and oriented to person, place, and time.  Psychiatric:        Mood and Affect: Mood normal.        Thought Content: Thought content normal.        Judgment: Judgment normal.    Assessment and Plan: Hypertension, unspecified type  Assessment & Plan: Uncontrolled. Recommend to increase Amlodipine to 10mg  daily from 5mg  daily. Recommend to continue to monitor her BP BID and provide readings at next appointment, either face to face or telehealth. Recommend her to take 2 tablets of Amlodipine 5mg  to equal 10mg  total.   Orders: -     amLODIPine Besylate; Take 1 tablet (10 mg total) by mouth daily.  Dispense: 90 tablet  Prediabetes  -Patient is tolerating Metformin for prediabetes and advised she could start to take medication in the morning along with Amlodipine.  -Encouraged to continue to walk 150 minutes a week, a 30 minute walk daily or every other day, at least every 3rd day.  Follow Up Instructions: Return in about 2 weeks (around 10/08/2023) for follow-up; face to face or telehealth; 2 month lab visit-vitamin D .   I discussed the assessment and treatment plan with the patient. The patient was provided an opportunity to ask questions and all were answered. The patient agreed with the plan and demonstrated an understanding of the instructions.   The patient was advised to call back or seek an in-person evaluation if the symptoms worsen or if the condition fails to improve as anticipated.  Zandra Abts, NP

## 2023-09-24 NOTE — Assessment & Plan Note (Signed)
Uncontrolled. Recommend to increase Amlodipine to 10mg  daily from 5mg  daily. Recommend to continue to monitor her BP BID and provide readings at next appointment, either face to face or telehealth. Recommend her to take 2 tablets of Amlodipine 5mg  to equal 10mg  total.

## 2023-10-08 ENCOUNTER — Telehealth: Payer: No Typology Code available for payment source | Admitting: Family Medicine

## 2023-10-08 ENCOUNTER — Ambulatory Visit: Payer: No Typology Code available for payment source | Admitting: Family Medicine

## 2023-10-08 ENCOUNTER — Telehealth (INDEPENDENT_AMBULATORY_CARE_PROVIDER_SITE_OTHER): Payer: No Typology Code available for payment source | Admitting: Family Medicine

## 2023-10-08 ENCOUNTER — Encounter: Payer: Self-pay | Admitting: Family Medicine

## 2023-10-08 DIAGNOSIS — E559 Vitamin D deficiency, unspecified: Secondary | ICD-10-CM

## 2023-10-08 DIAGNOSIS — I10 Essential (primary) hypertension: Secondary | ICD-10-CM | POA: Diagnosis not present

## 2023-10-08 DIAGNOSIS — R7303 Prediabetes: Secondary | ICD-10-CM | POA: Diagnosis not present

## 2023-10-08 MED ORDER — HYDROCHLOROTHIAZIDE 12.5 MG PO TABS: 12.5000 mg | ORAL_TABLET | Freq: Every day | ORAL | 3 refills | Status: AC

## 2023-10-08 NOTE — Progress Notes (Signed)
Virtual Visit via Video Note  I connected with Cassandra Chandler on 10/08/23 at  9:30 AM EST by a video enabled telemedicine application and verified that I am speaking with the correct person using two identifiers.  Location patient: home Location provider:work or home office Persons participating in the virtual visit: patient, provider  I discussed the limitations of evaluation and management by telemedicine and the availability of in person appointments. The patient expressed understanding and agreed to proceed. Chief Complaint  Patient presents with   Hypertension    Patient has been taking BP at home 150/90,  started taken 2 tabs of the amaloipine      HPI: Pt is a 42 yo female followed by Zandra Abts, NP and seen as PCP out of office for 2 wk bp f/u.  Norvasc increased from 5 mg to 10 mg daily on 09/24/23.  Bp 150/90 avg, with 168/102 the highest, and 137/82 the lowest.  Pt just bought her bp cuff a few months ago.  Drinking 2-3 16.9 oz bottles of water per day.  Eating fast food a few days per wk.   Has not noticed any difference since taking metformin xr 500 mg and vitamin d.  ROS: See pertinent positives and negatives per HPI.  Past Medical History:  Diagnosis Date   Alcohol abuse    Back pain    Chest pain    Hyperlipidemia    Hypertension    Not on medication as of 03/2014   Obesity    Prediabetes    Swelling    Vitamin D deficiency     Past Surgical History:  Procedure Laterality Date   CESAREAN SECTION     As of 03/2014, x2 (failed induction, then repeat for failed TOLAC)   CESAREAN SECTION WITH BILATERAL TUBAL LIGATION Bilateral 07/30/2014   Procedure: CESAREAN SECTION WITH BILATERAL TUBAL LIGATION;  Surgeon: Tereso Newcomer, MD;  Location: [facility];  Service: Obstetrics;  Laterality: Bilateral;   TUBAL LIGATION      Family History  Problem Relation Age of Onset   Hypertension Mother    Anemia Mother    Obesity Mother    Hypertension Father     Alcoholism Father    Obesity Father    Hypertension Maternal Grandfather    Cancer Neg Hx    Diabetes Neg Hx    Stroke Neg Hx    Heart disease Neg Hx      Current Outpatient Medications:    amLODipine (NORVASC) 10 MG tablet, Take 1 tablet (10 mg total) by mouth daily., Disp: 90 tablet, Rfl:    metFORMIN (GLUCOPHAGE-XR) 500 MG 24 hr tablet, Take 1 tablet (500 mg total) by mouth daily with breakfast., Disp: 90 tablet, Rfl: 0   Multiple Vitamin (MULTI VITAMIN PO), Take by mouth daily. Women One A Day., Disp: , Rfl:    Vitamin D, Ergocalciferol, (DRISDOL) 1.25 MG (50000 UNIT) CAPS capsule, Take 1 capsule (50,000 Units total) by mouth every 7 (seven) days., Disp: 12 capsule, Rfl: 0  EXAM:  VITALS per patient if applicable:  RR between 12-20 bpm  GENERAL: alert, oriented, appears well and in no acute distress  HEENT: atraumatic, conjunctiva clear, no obvious abnormalities on inspection of external nose and ears  NECK: normal movements of the head and neck  LUNGS: on inspection no signs of respiratory distress, breathing rate appears normal, no obvious gross SOB, gasping or wheezing  CV: no obvious cyanosis  MS: moves all visible extremities without noticeable abnormality  PSYCH/NEURO: pleasant and cooperative, no obvious depression or anxiety, speech and thought processing grossly intact  ASSESSMENT AND PLAN:  Discussed the following assessment and plan:  Essential hypertension  -remains uncontrolled -continue Norvasc 10 mg daily -start hydrochlorothiazide 12.5 mg daily -lifestyle modifications -bring bp cuff to next ofv.  Will need bmp to check potassium - Plan: hydrochlorothiazide (HYDRODIURIL) 12.5 MG tablet  Vitamin D deficiency -Vitamin D 7.43 on 08/27/2023 -Continue ergocalciferol 50,000 IUs weekly  Prediabetes -Hemoglobin A1c 6.1% on 08/27/2023 -Continue metformin XL 500 mg -Lifestyle modifications  F/u in 4 wks, sooner if needed.   I discussed the assessment  and treatment plan with the patient. The patient was provided an opportunity to ask questions and all were answered. The patient agreed with the plan and demonstrated an understanding of the instructions.   The patient was advised to call back or seek an in-person evaluation if the symptoms worsen or if the condition fails to improve as anticipated.   Deeann Saint, MD

## 2023-11-26 ENCOUNTER — Other Ambulatory Visit: Payer: Self-pay

## 2023-12-01 DIAGNOSIS — Z419 Encounter for procedure for purposes other than remedying health state, unspecified: Secondary | ICD-10-CM | POA: Diagnosis not present

## 2023-12-03 ENCOUNTER — Encounter: Payer: Self-pay | Admitting: Family Medicine

## 2024-08-29 ENCOUNTER — Other Ambulatory Visit: Payer: Self-pay

## 2024-08-29 DIAGNOSIS — Z1231 Encounter for screening mammogram for malignant neoplasm of breast: Secondary | ICD-10-CM

## 2024-10-03 ENCOUNTER — Ambulatory Visit

## 2024-11-10 ENCOUNTER — Ambulatory Visit: Admission: RE | Admit: 2024-11-10 | Discharge: 2024-11-10 | Disposition: A | Source: Ambulatory Visit

## 2024-11-10 DIAGNOSIS — Z1231 Encounter for screening mammogram for malignant neoplasm of breast: Secondary | ICD-10-CM
